# Patient Record
Sex: Female | Born: 1990 | Hispanic: Yes | Marital: Married | Smoking: Never smoker
Health system: Southern US, Community
[De-identification: ages and names within clinical notes are randomized; demographics above are authoritative.]

## PROBLEM LIST (undated history)

## (undated) ENCOUNTER — Inpatient Hospital Stay (HOSPITAL_COMMUNITY): Payer: Self-pay

## (undated) DIAGNOSIS — F1010 Alcohol abuse, uncomplicated: Secondary | ICD-10-CM

## (undated) DIAGNOSIS — F319 Bipolar disorder, unspecified: Secondary | ICD-10-CM

## (undated) DIAGNOSIS — F419 Anxiety disorder, unspecified: Secondary | ICD-10-CM

## (undated) DIAGNOSIS — F332 Major depressive disorder, recurrent severe without psychotic features: Secondary | ICD-10-CM

## (undated) DIAGNOSIS — R45851 Suicidal ideations: Secondary | ICD-10-CM

## (undated) HISTORY — DX: Alcohol abuse, uncomplicated: F10.10

## (undated) HISTORY — DX: Bipolar disorder, unspecified: F31.9

## (undated) HISTORY — DX: Suicidal ideations: R45.851

## (undated) HISTORY — PX: OTHER SURGICAL HISTORY: SHX169

## (undated) HISTORY — DX: Major depressive disorder, recurrent severe without psychotic features: F33.2

---

## 2015-04-14 ENCOUNTER — Emergency Department (HOSPITAL_COMMUNITY)
Admission: EM | Admit: 2015-04-14 | Discharge: 2015-04-14 | Disposition: A | Discharge status: Other Acute Inpatient Hospital | Payer: Federal, State, Local not specified - Other | Attending: Emergency Medicine | Admitting: Emergency Medicine

## 2015-04-14 ENCOUNTER — Encounter (HOSPITAL_COMMUNITY): Payer: Self-pay | Admitting: *Deleted

## 2015-04-14 ENCOUNTER — Inpatient Hospital Stay (HOSPITAL_COMMUNITY)
Admission: AD | Admit: 2015-04-14 | Discharge: 2015-04-18 | DRG: 885 | Disposition: A | Discharge status: Home / Self Care | Payer: Federal, State, Local not specified - Other | Source: Intra-hospital | Attending: Psychiatry | Admitting: Psychiatry

## 2015-04-14 DIAGNOSIS — F10129 Alcohol abuse with intoxication, unspecified: Secondary | ICD-10-CM | POA: Insufficient documentation

## 2015-04-14 DIAGNOSIS — F332 Major depressive disorder, recurrent severe without psychotic features: Secondary | ICD-10-CM

## 2015-04-14 DIAGNOSIS — R45851 Suicidal ideations: Secondary | ICD-10-CM

## 2015-04-14 DIAGNOSIS — F322 Major depressive disorder, single episode, severe without psychotic features: Secondary | ICD-10-CM | POA: Diagnosis present

## 2015-04-14 DIAGNOSIS — Z3202 Encounter for pregnancy test, result negative: Secondary | ICD-10-CM | POA: Insufficient documentation

## 2015-04-14 DIAGNOSIS — F1010 Alcohol abuse, uncomplicated: Secondary | ICD-10-CM | POA: Diagnosis present

## 2015-04-14 DIAGNOSIS — F419 Anxiety disorder, unspecified: Secondary | ICD-10-CM | POA: Diagnosis present

## 2015-04-14 DIAGNOSIS — G4700 Insomnia, unspecified: Secondary | ICD-10-CM | POA: Diagnosis present

## 2015-04-14 DIAGNOSIS — Z818 Family history of other mental and behavioral disorders: Secondary | ICD-10-CM

## 2015-04-14 HISTORY — DX: Anxiety disorder, unspecified: F41.9

## 2015-04-14 LAB — CBC
HCT: 42.3 % (ref 36.0–46.0)
Hemoglobin: 13.7 g/dL (ref 12.0–15.0)
MCH: 29.6 pg (ref 26.0–34.0)
MCHC: 32.4 g/dL (ref 30.0–36.0)
MCV: 91.4 fL (ref 78.0–100.0)
PLATELETS: 294 10*3/uL (ref 150–400)
RBC: 4.63 MIL/uL (ref 3.87–5.11)
RDW: 13.4 % (ref 11.5–15.5)
WBC: 7.3 10*3/uL (ref 4.0–10.5)

## 2015-04-14 LAB — COMPREHENSIVE METABOLIC PANEL
ALK PHOS: 64 U/L (ref 38–126)
ALT: 17 U/L (ref 14–54)
AST: 24 U/L (ref 15–41)
Albumin: 4.5 g/dL (ref 3.5–5.0)
Anion gap: 12 (ref 5–15)
BILIRUBIN TOTAL: 0.4 mg/dL (ref 0.3–1.2)
BUN: 11 mg/dL (ref 6–20)
CALCIUM: 9.4 mg/dL (ref 8.9–10.3)
CO2: 20 mmol/L — ABNORMAL LOW (ref 22–32)
CREATININE: 0.59 mg/dL (ref 0.44–1.00)
Chloride: 113 mmol/L — ABNORMAL HIGH (ref 101–111)
Glucose, Bld: 71 mg/dL (ref 65–99)
Potassium: 3.9 mmol/L (ref 3.5–5.1)
Sodium: 145 mmol/L (ref 135–145)
TOTAL PROTEIN: 7.6 g/dL (ref 6.5–8.1)

## 2015-04-14 LAB — RAPID URINE DRUG SCREEN, HOSP PERFORMED
AMPHETAMINES: NOT DETECTED
Barbiturates: NOT DETECTED
Benzodiazepines: NOT DETECTED
Cocaine: NOT DETECTED
Opiates: NOT DETECTED
Tetrahydrocannabinol: NOT DETECTED

## 2015-04-14 LAB — SALICYLATE LEVEL: Salicylate Lvl: <4 mg/dL (ref 2.8–30.0)

## 2015-04-14 LAB — I-STAT BETA HCG BLOOD, ED (MC, WL, AP ONLY)

## 2015-04-14 LAB — ACETAMINOPHEN LEVEL: Acetaminophen (Tylenol), Serum: <10 ug/mL — ABNORMAL LOW (ref 10–30)

## 2015-04-14 LAB — ETHANOL: ALCOHOL ETHYL (B): 61 mg/dL — AB (ref <5)

## 2015-04-14 MED ORDER — TRAZODONE HCL 50 MG PO TABS
50.0000 mg | ORAL_TABLET | Freq: Every day | ORAL | Status: DC
  Administered 2015-04-14 – 2015-04-17 (×4): 50 mg via ORAL
  Filled 2015-04-14 (×9): qty 1

## 2015-04-14 MED ORDER — FLUOXETINE HCL 10 MG PO CAPS
10.0000 mg | ORAL_CAPSULE | Freq: Every day | ORAL | Status: DC
  Administered 2015-04-14: 10 mg via ORAL
  Filled 2015-04-14: qty 1

## 2015-04-14 MED ORDER — ALUM & MAG HYDROXIDE-SIMETH 200-200-20 MG/5ML PO SUSP: 30.0000 mL | ORAL | Status: DC | PRN

## 2015-04-14 MED ORDER — FLUOXETINE HCL 10 MG PO CAPS
10.0000 mg | ORAL_CAPSULE | Freq: Every day | ORAL | Status: DC
  Administered 2015-04-15: 10 mg via ORAL
  Filled 2015-04-14 (×4): qty 1

## 2015-04-14 MED ORDER — TRAZODONE HCL 50 MG PO TABS: 50.0000 mg | ORAL_TABLET | Freq: Every day | ORAL | Status: DC

## 2015-04-14 MED ORDER — MAGNESIUM HYDROXIDE 400 MG/5ML PO SUSP: 30.0000 mL | Freq: Every day | ORAL | Status: DC | PRN

## 2015-04-14 MED ORDER — LORAZEPAM 1 MG PO TABS: 1.0000 mg | ORAL_TABLET | Freq: Three times a day (TID) | ORAL | Status: DC | PRN

## 2015-04-14 MED ORDER — ONDANSETRON HCL 4 MG PO TABS
4.0000 mg | ORAL_TABLET | Freq: Three times a day (TID) | ORAL | Status: DC | PRN
  Administered 2015-04-17: 4 mg via ORAL
  Filled 2015-04-14: qty 1

## 2015-04-14 MED ORDER — ALUM & MAG HYDROXIDE-SIMETH 200-200-20 MG/5ML PO SUSP
30.0000 mL | ORAL | Status: DC | PRN
Start: 2015-04-14 — End: 2015-04-14

## 2015-04-14 MED ORDER — ACETAMINOPHEN 325 MG PO TABS: 650.0000 mg | ORAL_TABLET | ORAL | Status: DC | PRN

## 2015-04-14 MED ORDER — ONDANSETRON HCL 4 MG PO TABS: 4.0000 mg | ORAL_TABLET | Freq: Three times a day (TID) | ORAL | Status: DC | PRN

## 2015-04-14 MED ORDER — ACETAMINOPHEN 325 MG PO TABS: 650.0000 mg | ORAL_TABLET | Freq: Four times a day (QID) | ORAL | Status: DC | PRN

## 2015-04-14 NOTE — ED Notes (Signed)
Placed pt's belongings in locker #30. Pt is resting quietly in bed with lights off and blinds/curtains open to nurse's station.  No acute distress noted.
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
## 2015-04-14 NOTE — Discharge Instructions (Signed)
Pt Transferred to rm 402-1 @[facility]  Report called to Sears Holdings CorporationChris Rn . Pt reports passive S/I Transported via [facility] affect blunted .

## 2015-04-14 NOTE — Tx Team (Signed)
Initial Interdisciplinary Treatment Plan   PATIENT STRESSORS: Financial difficulties Marital or family conflict Occupational concerns Substance abuse   PATIENT STRENGTHS: Ability for insight Active sense of humor Average or above average intelligence Capable of independent living Communication skills General fund of knowledge Motivation for treatment/growth Physical Health Supportive family/friends Work skills   PROBLEM LIST: Problem List/Patient Goals Date to be addressed Date deferred Reason deferred Estimated date of resolution  "depression" 04/14/2015   D/c  "anxiety" 04/14/2015   D/c  "suicidal thoughts" 04/14/2015   D/c  "alcohol" 04/14/2015   D/c  "panic attacks" 04/14/2015   D/c                           DISCHARGE CRITERIA:  Ability to meet basic life and health needs Adequate post-discharge living arrangements Improved stabilization in mood, thinking, and/or behavior Medical problems require only outpatient monitoring Motivation to continue treatment in a less acute level of care Need for constant or close observation no longer present Reduction of life-threatening or endangering symptoms to within safe limits Safe-care adequate arrangements made Verbal commitment to aftercare and medication compliance Withdrawal symptoms are absent or subacute and managed without 24-hour nursing intervention  PRELIMINARY DISCHARGE PLAN: Attend aftercare/continuing care group Attend PHP/IOP Attend 12-step recovery group Outpatient therapy Return to previous living arrangement Return to previous work or school arrangements  PATIENT/FAMIILY INVOLVEMENT: This treatment plan has been presented to and reviewed with the patient, Erin Sullivan".  The patient and family have been given the opportunity to ask questions and make suggestions.  Earline MayotteKnight, Lillia Lengel Shephard 04/14/2015, 7:38 PM

## 2015-04-14 NOTE — BH Assessment (Signed)
[facility] Assessment Progress Note  Per Thedore MinsMojeed Akintayo, MD, this pt requires psychiatric hospitalization at this time.  Cliffton AstersKenishia, RN, [facility]  Pt has signed Voluntary Admission and Consent for Treatment, as well as Consent to Release Information to no one, and signed forms [facility].  Pt's nurse, Lupita LeashDonna, has been notified, and agrees to send original paperwork along with pt via [facility], and to call report to [phone].  Doylene Canninghomas Stasia Somero, MA Triage Specialist [phone]

## 2015-04-14 NOTE — [facility] Counselor (Signed)
Patient has been accepted to 402-1 by Dr. Jannifer FranklinAkintayo. Patients nurse and NP made aware of acceptance. Patient can be transported after 3:30PM.  Davina PokeJoVea Kiauna Zywicki, LCSW Therapeutic Triage Specialist [facility] 04/14/2015 2:10 PM

## 2015-04-14 NOTE — ED Notes (Addendum)
Pt appears very blunted and depressed. She stated she does feel Si but does contract for safety. Pt remains with a sitter. 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 She has been depressed for a long time but never received treatment. Pt stated she has two plans to die. She often thinks of crashing her car and also thought about cutting herself. Pt was wiping away tears as she spoke. She stated "a lot of things have happened to people I care about." "I am not sleeping." Pt was engaged and gave her BF back the ring two days ago. She stated,"I planned to quit my job and go with him but I am not sure now." Pt lives with her sister. She denies any abuse in the past and denies any legal or financial difficulties. 9am _pt refused to eat breakfast. Pt is very soft spoken with good eye contact. 12:20pm _Phoned [facility] to give report. [facility] staff will call back. Charge nurse, Darl PikesSusan made aware. 1:30pm _ Report to Lupita LeashDonna.

## 2015-04-14 NOTE — [facility] Group Notes (Signed)
Pt did not attend wrap up group.  Nichol Ator, MHT 

## 2015-04-14 NOTE — BH Assessment (Signed)
Assessment Note  Erin Sullivan is an 25 y.o. female. Patient reports driving herself to the Emergency department because of thoughts of wanting to kill self.  Patient continues to endorse suicidal thoughts with a plan to cut self. "I didn't feel safe alone, like I would kill myself".   Patient reports a previous attempt by overdose 3 years ago but she did not seek treatment or tell her family.  Patient does not want her family to know she is here seeking treatment.  Patient denies any inpatient or outpatient treatment in the past for mental health or substance abuse.  Patient denies homicidal ideations, A/VH, and other self injurious behaviors.  Patient reports a family history of mental health diagnosis unknown to her at this time.  Patient reports her brother struggles with hallucinations, mother is bipolar with past suicide attempts, 2 cousins killed themselves by hanging, and her grandfather attempted murder suicide although did not kill her grandmother.    Patient reports drinking alcohol(vodka) 3-4 times a week, started drinking at age 25, and she noticed her drinking getting out of control at age 25 but currently denies a need for treatment.    Patient currently recognize a need for current mental health treatment.    Diagnosis: Major Depressive Disorder, single episode, severe  Past Medical History: History reviewed. No pertinent past medical history.  Past Surgical History  Procedure Laterality Date  . Birthmark removal      from posterior leg    Family History: No family history on file.  Social History:  reports that she has never smoked. She does not have any smokeless tobacco history on file. She reports that she drinks alcohol. She reports that she does not use illicit drugs.  Additional Social History:  Alcohol / Drug Use Pain Medications: see chart Prescriptions: see chart Over the Counter: see chart History of alcohol / drug use?: Yes Longest period of sobriety (when/how  long): couple months Negative Consequences of Use:  (pt does not believe drinking is a problems.) Withdrawal Symptoms:  (denies current or history of withdrawal sx) Substance #1 Name of Substance 1: alcohol 1 - Age of First Use: 21 1 - Amount (size/oz): 6-8oz vodka 1 - Frequency: 3-4 times weekly 1 - Last Use / Amount: 04/11/14  CIWA: CIWA-Ar BP: 129/94 mmHg Pulse Rate: 77 Nausea and Vomiting: no nausea and no vomiting Tactile Disturbances: none Tremor: no tremor Auditory Disturbances: not present Paroxysmal Sweats: no sweat visible Visual Disturbances: not present Anxiety: no anxiety, at ease Headache, Fullness in Head: none present Agitation: normal activity Orientation and Clouding of Sensorium: oriented and can do serial additions CIWA-Ar Total: 0 COWS:    Allergies: No Known Allergies  Home Medications:  (Not in a hospital admission)  OB/GYN Status:  Patient's last menstrual period was 03/13/2015.  General Assessment Data Location of Assessment: [facility] TTS Assessment: In system Is this a Tele or Face-to-Face Assessment?: Face-to-Face Is this an Initial Assessment or a Re-assessment for this encounter?: Initial Assessment Marital status: Single Maiden name: Clarene Sullivan Is patient pregnant?: No Pregnancy Status: No Living Arrangements: Other relatives (Sister) Can pt return to current living arrangement?: Yes Admission Status: Voluntary Is patient capable of signing voluntary admission?: Yes Referral Source: Self/Family/Friend Insurance type: none  Medical Screening Exam [facility] Walk-[facility] ONLY) Medical Exam completed: Yes  Crisis Care Plan Living Arrangements: Other relatives (Sister) Name of Psychiatrist: none Name of Therapist: none  Education Status Is patient currently in school?: No Current Grade: na Highest grade of  school patient has completed: some college Name of school: na Contact person: na  Risk to self with the past 6 months Suicidal Ideation:  Yes-Currently Present Has patient been a risk to self within the past 6 months prior to admission? : No Suicidal Intent: Yes-Currently Present Has patient had any suicidal intent within the past 6 months prior to admission? : No Is patient at risk for suicide?: Yes Suicidal Plan?: Yes-Currently Present Has patient had any suicidal plan within the past 6 months prior to admission? : No Specify Current Suicidal Plan: cut self Access to Means: Yes Specify Access to Suicidal Means: any sharpe items What has been your use of drugs/alcohol within the last 12 months?: alcohol Previous Attempts/Gestures: Yes How many times?: 1 Other Self Harm Risks: denies Triggers for Past Attempts: Family contact, Other personal contacts, Other (Comment) (depression) Intentional Self Injurious Behavior: None Family Suicide History: Yes (multiple people in immediate family) Recent stressful life event(s): Conflict (Comment), Loss (Comment), Financial Problems, Other (Comment) (overwhelmed) Persecutory voices/beliefs?: No Depression: Yes Depression Symptoms: Tearfulness, Loss of interest in usual pleasures, Feeling worthless/self pity, Feeling angry/irritable (hopelessness) Substance abuse history and/or treatment for substance abuse?: Yes  Risk to Others within the past 6 months Homicidal Ideation: No-Not Currently/Within Last 6 Months Does patient have any lifetime risk of violence toward others beyond the six months prior to admission? : No Thoughts of Harm to Others: No-Not Currently Present/Within Last 6 Months Current Homicidal Intent: No-Not Currently/Within Last 6 Months Current Homicidal Plan: No-Not Currently/Within Last 6 Months Access to Homicidal Means: No Identified Victim: na History of harm to others?: No Assessment of Violence: None Noted Violent Behavior Description: na Does patient have access to weapons?: No Criminal Charges Pending?: No Does patient have a court date: No Is patient  on probation?: No  Psychosis Hallucinations: None noted Delusions: None noted  Mental Status Report Appearance/Hygiene: In hospital gown Eye Contact: Good Motor Activity: Freedom of movement Speech: Logical/coherent Level of Consciousness: Alert Mood: Depressed Affect: Depressed Anxiety Level: None Thought Processes: Relevant Judgement: Unimpaired Orientation: Person, Place, Time, Situation, Appropriate for developmental age Obsessive Compulsive Thoughts/Behaviors: None  Cognitive Functioning Concentration: Normal Memory: Recent Intact, Remote Intact IQ: Average Insight: Fair Impulse Control: Fair Appetite: Fair Weight Loss: 0 Weight Gain: 0 Sleep: Decreased Total Hours of Sleep: 4 Vegetative Symptoms: None  ADLScreening [facility] Assessment Services) Patient's cognitive ability adequate to safely complete daily activities?: Yes Patient able to express need for assistance with ADLs?: Yes Independently performs ADLs?: Yes (appropriate for developmental age)  Prior Inpatient Therapy Prior Inpatient Therapy: No Prior Therapy Dates: na Prior Therapy Facilty/Provider(s): na Reason for Treatment: na  Prior Outpatient Therapy Prior Outpatient Therapy: No Prior Therapy Dates: na Prior Therapy Facilty/Provider(s): na Reason for Treatment: na Does patient have an ACCT team?: No Does patient have Intensive In-House Services?  : No Does patient have [facility] services? : No Does patient have P4CC services?: No  ADL Screening (condition at time of admission) Patient's cognitive ability adequate to safely complete daily activities?: Yes Patient able to express need for assistance with ADLs?: Yes Independently performs ADLs?: Yes (appropriate for developmental age)       Abuse/Neglect Assessment (Assessment to be complete while patient is alone) Physical Abuse: Denies Verbal Abuse: Denies Sexual Abuse: Denies Exploitation of patient/patient's resources:  Denies Self-Neglect: Denies Values / Beliefs Cultural Requests During Hospitalization: None Spiritual Requests During Hospitalization: None Consults Spiritual Care Consult Needed: No Social Work Consult Needed: No Merchant navy officer (For Healthcare)  Does patient have an advance directive?: No Would patient like information on creating an advanced directive?: No - patient declined information    Additional Information 1:1 In Past 12 Months?: No CIRT Risk: No Elopement Risk: No Does patient have medical clearance?: Yes     Disposition:  Disposition Initial Assessment Completed for this Encounter: Yes Disposition of Patient: Other dispositions (Pending) Other disposition(s): Other (Comment) (Pending)  On Site Evaluation by:   Reviewed with Physician:    Maryelizabeth Rowan A 04/14/2015 7:58 AM

## 2015-04-14 NOTE — ED Notes (Signed)
Bed: WA30 Expected date:  Expected time:  Means of arrival:  Comments: 

## 2015-04-14 NOTE — Consult Note (Signed)
[place] [facility] Consult   Reason for Consult:  Suicidal ideations Referring Physician:  EDP Patient Identification: Erin Sullivan MRN:  [id number] Principal Diagnosis: Severe recurrent major depression without psychotic features [facility]) Diagnosis:   Patient Active Problem List   Diagnosis Date Noted  . Severe recurrent major depression without psychotic features ([place]) [F33.2] 04/14/2015    Priority: High  . Alcohol abuse [F10.10] 04/14/2015    Priority: High    Total Time spent with patient: 45 minutes  Subjective:   Erin Sullivan is a 25 y.o. female patient admitted with suicidal ideations and a plan to overdose.  HPI:  On admission:  25 y.o. female. Patient reports driving herself to the Emergency department because of thoughts of wanting to kill self. Patient continues to endorse suicidal thoughts with a plan to cut self. "I didn't feel safe alone, like I would kill myself". Patient reports a previous attempt by overdose 3 years ago but she did not seek treatment or tell her family. Patient does not want her family to know she is here seeking treatment. Patient denies any inpatient or outpatient treatment in the past for mental health or substance abuse. Patient denies homicidal ideations, A/VH, and other self injurious behaviors. Patient reports a family history of mental health diagnosis unknown to her at this time. Patient reports her brother struggles with hallucinations, mother is bipolar with past suicide attempts, 2 cousins killed themselves by hanging, and her grandfather attempted murder suicide although did not kill her grandmother.   Patient reports drinking alcohol(vodka) 3-4 times a week, started drinking at age 80, and she noticed her drinking getting out of control at age 75 but currently denies a need for treatment.   Today:  Patient continues to be depressed and endorses suicidal ideations with a plan to overdose or cut herself.  She did overdose  four years ago but never received help when she did not die.  No treatment in the past.  She currently lives with her sister, sister's boyfriend, and her sister's children.  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  They are having relationship issues at this time.  Denies homicidal ideations, hallucinations, and withdrawal symptoms.  Past Psychiatric History: depression, alcohol abuse  Risk to Self: Suicidal Ideation: Yes-Currently Present Suicidal Intent: Yes-Currently Present Is patient at risk for suicide?: Yes Suicidal Plan?: Yes-Currently Present Specify Current Suicidal Plan: cut self Access to Means: Yes Specify Access to Suicidal Means: any sharpe items What has been your use of drugs/alcohol within the last 12 months?: alcohol How many times?: 1 Other Self Harm Risks: denies Triggers for Past Attempts: Family contact, Other personal contacts, Other (Comment) (depression) Intentional Self Injurious Behavior: None Risk to Others: Homicidal Ideation: No-Not Currently/Within Last 6 Months Thoughts of Harm to Others: No-Not Currently Present/Within Last 6 Months Current Homicidal Intent: No-Not Currently/Within Last 6 Months Current Homicidal Plan: No-Not Currently/Within Last 6 Months Access to Homicidal Means: No Identified Victim: na History of harm to others?: No Assessment of Violence: None Noted Violent Behavior Description: na Does patient have access to weapons?: No Criminal Charges Pending?: No Does patient have a court date: No Prior Inpatient Therapy: Prior Inpatient Therapy: No Prior Therapy Dates: na Prior Therapy Facilty/Provider(s): na Reason for Treatment: na Prior Outpatient Therapy: Prior Outpatient Therapy: No Prior Therapy Dates: na Prior Therapy Facilty/Provider(s): na Reason for Treatment: na Does patient  have an ACCT team?: No Does  patient have Intensive In-House Services?  : No Does patient have [facility] services? : No Does patient have P4CC services?: No  Past Medical History: History reviewed. No pertinent past medical history.  Past Surgical History  Procedure Laterality Date  . Birthmark removal      from posterior leg   Family History: No family history on file. Family Psychiatric  History: None Social History:  History  Alcohol Use  . Yes    Comment: pt states at least 3 times a week     History  Drug Use No    Social History   Social History  . Marital Status: Single    Spouse Name: N/A  . Number of Children: N/A  . Years of Education: N/A   Social History Main Topics  . Smoking status: Never Smoker   . Smokeless tobacco: None  . Alcohol Use: Yes     Comment: pt states at least 3 times a week  . Drug Use: No  . Sexual Activity: Not Asked   Other Topics Concern  . None   Social History Narrative  . None   Additional Social History:    Pain Medications: see chart Prescriptions: see chart Over the Counter: see chart History of alcohol / drug use?: Yes Longest period of sobriety (when/how long): couple months Negative Consequences of Use:  (pt does not believe drinking is a problems.) Withdrawal Symptoms:  (denies current or history of withdrawal sx) Name of Substance 1: alcohol 1 - Age of First Use: 21 1 - Amount (size/oz): 6-8oz vodka 1 - Frequency: 3-4 times weekly 1 - Last Use / Amount: 04/11/14                   Allergies:  No Known Allergies  Labs:  Results for orders placed or performed during the hospital encounter of 04/14/15 (from the past 48 hour(s))  Comprehensive metabolic panel     Status: Abnormal   Collection Time: 04/14/15  4:40 AM  Result Value Ref Range   Sodium 145 135 - 145 mmol/L   Potassium 3.9 3.5 - 5.1 mmol/L   Chloride 113 (H) 101 - 111 mmol/L   CO2 20 (L) 22 - 32 mmol/L   Glucose, Bld 71 65 - 99 mg/dL   BUN 11 6 - 20 mg/dL   Creatinine,  Ser 0.12 0.44 - 1.00 mg/dL   Calcium 9.4 8.9 - 58.9 mg/dL   Total Protein 7.6 6.5 - 8.1 g/dL   Albumin 4.5 3.5 - 5.0 g/dL   AST 24 15 - 41 U/L   ALT 17 14 - 54 U/L   Alkaline Phosphatase 64 38 - 126 U/L   Total Bilirubin 0.4 0.3 - 1.2 mg/dL   GFR calc non Af Amer >60 >60 mL/min   GFR calc Af Amer >60 >60 mL/min    Comment: (NOTE) The eGFR has been calculated using the CKD EPI equation. This calculation has not been validated in all clinical situations. eGFR's persistently <60 mL/min signify possible Chronic Kidney Disease.    Anion gap 12 5 - 15  Ethanol (ETOH)     Status: Abnormal   Collection Time: 04/14/15  4:40 AM  Result Value Ref Range   Alcohol, Ethyl (B) 61 (H) <5 mg/dL    Comment:        LOWEST DETECTABLE LIMIT FOR SERUM ALCOHOL IS 5 mg/dL FOR MEDICAL PURPOSES ONLY   Salicylate level     Status:  None   Collection Time: 04/14/15  4:40 AM  Result Value Ref Range   Salicylate Lvl <4.0 2.8 - 30.0 mg/dL  Acetaminophen level     Status: Abnormal   Collection Time: 04/14/15  4:40 AM  Result Value Ref Range   Acetaminophen (Tylenol), Serum <10 (L) 10 - 30 ug/mL    Comment:        THERAPEUTIC CONCENTRATIONS VARY SIGNIFICANTLY. A RANGE OF 10-30 ug/mL MAY BE AN EFFECTIVE CONCENTRATION FOR MANY PATIENTS. HOWEVER, SOME ARE BEST TREATED AT CONCENTRATIONS OUTSIDE THIS RANGE. ACETAMINOPHEN CONCENTRATIONS >150 ug/mL AT 4 HOURS AFTER INGESTION AND >50 ug/mL AT 12 HOURS AFTER INGESTION ARE OFTEN ASSOCIATED WITH TOXIC REACTIONS.   CBC     Status: None   Collection Time: 04/14/15  4:40 AM  Result Value Ref Range   WBC 7.3 4.0 - 10.5 K/uL   RBC 4.63 3.87 - 5.11 MIL/uL   Hemoglobin 13.7 12.0 - 15.0 g/dL   HCT 42.3 36.0 - 46.0 %   MCV 91.4 78.0 - 100.0 fL   MCH 29.6 26.0 - 34.0 pg   MCHC 32.4 30.0 - 36.0 g/dL   RDW 13.4 11.5 - 15.5 %   Platelets 294 150 - 400 K/uL  I-Stat beta hCG blood, ED ([facility], [facility], [facility] only)     Status: None   Collection Time: 04/14/15  4:52 AM  Result  Value Ref Range   I-stat hCG, quantitative <5.0 <5 mIU/mL   Comment 3            Comment:   GEST. AGE      CONC.  (mIU/mL)   <=1 WEEK        5 - 50     2 WEEKS       50 - 500     3 WEEKS       100 - 10,000     4 WEEKS     1,000 - 30,000        FEMALE AND NON-PREGNANT FEMALE:     LESS THAN 5 mIU/mL   Urine rapid drug screen (hosp performed) (Not at [facility])     Status: None   Collection Time: 04/14/15  5:59 AM  Result Value Ref Range   Opiates NONE DETECTED NONE DETECTED   Cocaine NONE DETECTED NONE DETECTED   Benzodiazepines NONE DETECTED NONE DETECTED   Amphetamines NONE DETECTED NONE DETECTED   Tetrahydrocannabinol NONE DETECTED NONE DETECTED   Barbiturates NONE DETECTED NONE DETECTED    Comment:        DRUG SCREEN FOR MEDICAL PURPOSES ONLY.  IF CONFIRMATION IS NEEDED FOR ANY PURPOSE, NOTIFY LAB WITHIN 5 DAYS.        LOWEST DETECTABLE LIMITS FOR URINE DRUG SCREEN Drug Class       Cutoff (ng/mL) Amphetamine      1000 Barbiturate      200 Benzodiazepine   102 Tricyclics       725 Opiates          300 Cocaine          300 THC              50     Current Facility-Administered Medications  Medication Dose Route Frequency Provider Last Rate Last Dose  . acetaminophen (TYLENOL) tablet 650 mg  650 mg Oral Q4H PRN Tatyana Kirichenko, PA-C      . alum & mag hydroxide-simeth (MAALOX/MYLANTA) 200-200-20 MG/5ML suspension 30 mL  30 mL Oral PRN Tatyana Kirichenko, PA-C      .  FLUoxetine (PROZAC) capsule 10 mg  10 mg Oral Daily Angeleigh Chiasson   10 mg at 04/14/15 1150  . ondansetron (ZOFRAN) tablet 4 mg  4 mg Oral Q8H PRN Tatyana Kirichenko, PA-C      . traZODone (DESYREL) tablet 50 mg  50 mg Oral QHS Amalya Salmons       No current outpatient prescriptions on file.    Musculoskeletal: Strength & Muscle Tone: within normal limits Gait & Station: normal Patient leans: N/A  Psychiatric Specialty Exam: Review of Systems  Constitutional: Negative.   HENT: Negative.   Eyes:  Negative.   Respiratory: Negative.   Cardiovascular: Negative.   Gastrointestinal: Negative.   Genitourinary: Negative.   Musculoskeletal: Negative.   Skin: Negative.   Neurological: Negative.   Endo/Heme/Allergies: Negative.   Psychiatric/Behavioral: Positive for depression, suicidal ideas and substance abuse.    Blood pressure 106/70, pulse 88, temperature 98.4 F (36.9 C), temperature source Oral, resp. rate 18, height _0  (1.626 m), weight 48.988 kg (108 lb), last menstrual period 03/13/2015, SpO2 96 %.Body mass index is 18.53 kg/(m^2).  General Appearance: Casual  Eye Contact::  Minimal  Speech:  Normal Rate  Volume:  Decreased  Mood:  Depressed  Affect:  Congruent  Thought Process:  Coherent  Orientation:  Full (Time, Place, and Person)  Thought Content:  Rumination  Suicidal Thoughts:  Yes.  with intent/plan  Homicidal Thoughts:  No  Memory:  Immediate;   Fair Recent;   Fair Remote;   Fair  Judgement:  Impaired  Insight:  Fair  Psychomotor Activity:  Decreased  Concentration:  Fair  Recall:  [place] of Knowledge:Good  Language: Good  Akathisia:  No  Handed:  Right  AIMS (if indicated):     Assets:  Housing Leisure Time Physical Health Resilience Social Support  ADL's:  Intact  Cognition: WNL  Sleep:      Treatment Plan Summary: Daily contact with patient to assess and evaluate symptoms and progress in treatment, Medication management and Plan major depression, recurrent, severe without psychotic features:  -Crisis stabilization -Medication management:  Prozac 10 mg daily for depression and Trazodone 50 mg at bedtime for sleep issues started -Individual and substance abuse counseling -Transferred to [facility] inpatient unit   Disposition: Recommend psychiatric Inpatient admission when medically cleared.  Waylan Boga, [place] 04/14/2015 2:16 PM Patient seen face-to-face for psychiatric evaluation, chart reviewed and case discussed with the physician  extender and developed treatment plan. Reviewed the information documented and agree with the treatment plan. Corena Pilgrim, MD

## 2015-04-14 NOTE — ED Notes (Signed)
Pt states that she has been depressed and thinking about suicide for a long time; pt states that she has had these thoughts since Middle School; pt states that the thoughts have been getting worse over the last few days; pt states that she has thoughts of cutting herself or running her car off the road; pt tearful in triage and states "I just can't deal with it anymore."; pt states that she has never been treated for depression or talked to anyone about her feelings

## 2015-04-14 NOTE — ED Provider Notes (Signed)
CSN: [id number]     Arrival date & time 04/14/15  4098 History   First MD Initiated Contact with Patient 04/14/15 0413     Chief Complaint  Patient presents with  . Suicidal     (Consider location/radiation/quality/duration/timing/severity/associated sxs/prior Treatment) HPI Erin Sullivan is a 25 y.o. female limited medical problems, presents to emergency department complaining of depression and suicidal thoughts. Patient states she has had depression and suicidal thoughts since middle school. She states she currently lives with her sister. She states that she has been feeling worse recently, states nothing in particular triggered it. She states that sometimes when she drives she has bad thoughts of driving off or into a tree. Patient also thought about cutting herself. She denies any attempts. She states that she did not tell anybody that she is here. She has never sick health or spoke with anybody about this. She is here voluntarily  History reviewed. No pertinent past medical history. Past Surgical History  Procedure Laterality Date  . Birthmark removal      from posterior leg   No family history on file. Social History  Substance Use Topics  . Smoking status: Never Smoker   . Smokeless tobacco: None  . Alcohol Use: Yes     Comment: pt states at least 3 times a week   OB History    No data available     Review of Systems  Constitutional: Negative for fever and chills.  Respiratory: Negative for cough, chest tightness and shortness of breath.   Cardiovascular: Negative for chest pain, palpitations and leg swelling.  Gastrointestinal: Negative for nausea, vomiting, abdominal pain and diarrhea.  Genitourinary: Negative for dysuria, flank pain and pelvic pain.  Musculoskeletal: Negative for myalgias, arthralgias, neck pain and neck stiffness.  Skin: Negative for rash.  Neurological: Negative for dizziness, weakness and headaches.  Psychiatric/Behavioral: Positive for suicidal  ideas and dysphoric mood. The patient is nervous/anxious.   All other systems reviewed and are negative.     Allergies  Review of patient's allergies indicates no known allergies.  Home Medications   Prior to Admission medications   Not on File   BP 129/94 mmHg  Pulse 77  Temp(Src) 98.4 F (36.9 C) (Oral)  Resp 14  Ht 5\' 4"  (1.626 m)  Wt 48.988 kg  BMI 18.53 kg/m2  SpO2 100%  LMP 03/13/2015 Physical Exam  Constitutional: She appears well-developed and well-nourished. No distress.  HENT:  Head: Normocephalic.  Eyes: Conjunctivae are normal.  Neck: Neck supple.  Cardiovascular: Normal rate, regular rhythm and normal heart sounds.   Pulmonary/Chest: Effort normal and breath sounds normal. No respiratory distress. She has no wheezes. She has no rales.  Abdominal: Soft. Bowel sounds are normal. She exhibits no distension. There is no tenderness. There is no rebound.  Musculoskeletal: She exhibits no edema.  Neurological: She is alert.  Skin: Skin is warm and dry.  Psychiatric: Her behavior is normal.  Pt is tearful, reports SI. No HI  Nursing note and vitals reviewed.   ED Course  Procedures (including critical care time) Labs Review Labs Reviewed  COMPREHENSIVE METABOLIC PANEL - Abnormal; Notable for the following:    Chloride 113 (*)    CO2 20 (*)    All other components within normal limits  ETHANOL - Abnormal; Notable for the following:    Alcohol, Ethyl (B) 61 (*)    All other components within normal limits  ACETAMINOPHEN LEVEL - Abnormal; Notable for the following:    Acetaminophen (  Tylenol), Serum <10 (*)    All other components within normal limits  SALICYLATE LEVEL  CBC  URINE RAPID DRUG SCREEN, HOSP PERFORMED  I-STAT BETA HCG BLOOD, ED ([facility], [facility], [facility] ONLY)    Imaging Review No results found. I have personally reviewed and evaluated these images and lab results as part of my medical decision-making.   EKG Interpretation None      MDM   Final  diagnoses:  Severe recurrent major depression without psychotic features (HCC)  Alcohol abuse    Pt with depression, admits to SI for "years." Pt is tearful. No medical problems. Will get med clearance labs. Pt is voluntary.   Medically cleared pending TTS assessment.       Jaynie Crumbleatyana Lenae Wherley, PA-C 04/15/15 1325  April Palumbo, MD 04/19/15 2354

## 2015-04-15 DIAGNOSIS — F10129 Alcohol abuse with intoxication, unspecified: Secondary | ICD-10-CM | POA: Insufficient documentation

## 2015-04-15 DIAGNOSIS — R45851 Suicidal ideations: Secondary | ICD-10-CM | POA: Insufficient documentation

## 2015-04-15 MED ORDER — ARIPIPRAZOLE 2 MG PO TABS
2.0000 mg | ORAL_TABLET | Freq: Every day | ORAL | Status: DC
  Administered 2015-04-15 – 2015-04-18 (×4): 2 mg via ORAL
  Filled 2015-04-15 (×3): qty 1
  Filled 2015-04-15: qty 7
  Filled 2015-04-15 (×4): qty 1

## 2015-04-15 MED ORDER — FLUOXETINE HCL 20 MG PO CAPS
20.0000 mg | ORAL_CAPSULE | Freq: Every day | ORAL | Status: DC
  Administered 2015-04-16 – 2015-04-18 (×3): 20 mg via ORAL
  Filled 2015-04-15: qty 7
  Filled 2015-04-15 (×5): qty 1

## 2015-04-15 NOTE — [facility] Group Notes (Signed)
Adult Therapy Group Note  Date:  04/15/2015 Time:  1:45-2:45 PM  Group Topic/Focus:  Managing Feelings:   The focus of this group was to identify what feelings patients have difficulty handling and develop a plan to handle them in a healthier way upon discharge.  Focus was on anger outbursts and an Anger Evaluation tool was used to elicit thought and motivate toward change.  Participation Level:  Active  Participation Quality:  Attentive and Sharing  Affect:  Blunted and Depressed  Cognitive:  Appropriate  Insight: Good  Engagement in Group:  Engaged  Modes of Intervention:  Discussion and Motivational Interviewing  Additional Comments:  Erin Sullivan participated well in group and was more vocal than most other participants.  She was able to analyze her anger at her boyfriend recently.  Erin Sullivan, Erin Sullivan 04/15/2015, 3:46 PM

## 2015-04-15 NOTE — Progress Notes (Signed)
  D: Pt was laying in bed during the assessment. Pt didn't engage in conversation, but did answer reluctantly answer some questions. Pt didn't have any questions or concerns at this time.   A:  Support and encouragement was offered. 15 min checks continued for safety.  R: Pt remains safe.

## 2015-04-15 NOTE — [facility] Suicide Risk Assessment (Signed)
[facility]   Nursing information obtained from:  Patient Demographic factors:  Adolescent or young adult, Low socioeconomic status Current Mental Status:  Suicidal ideation indicated by patient Loss Factors:  Financial problems / change in socioeconomic status Historical Factors:  Prior suicide attempts Risk Reduction Factors:  Employed, Living with another person, especially a relative, Positive social support Total Time spent with patient: 1 hour Principal Problem: <principal problem not specified> Diagnosis:   Patient Active Problem List   Diagnosis Date Noted  . 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 Alcohol abuse [F10.10] 04/14/2015  . Major depressive disorder, recurrent episode, severe (HCC) [F33.2] 04/14/2015     Continued Clinical Symptoms:  Alcohol Use Disorder Identification Test Final Score (AUDIT): 27 The "Alcohol Use Disorders Identification Test", Guidelines for Use in Primary Care, Second Edition.  World Science writerHealth Organization [facility](WHO). Score between 0-7:  no or low risk or alcohol related problems. Score between 8-15:  moderate risk of alcohol related problems. Score between 16-19:  high risk of alcohol related problems. Score 20 or above:  warrants further diagnostic evaluation for alcohol dependence and treatment.   CLINICAL FACTORS:   Severe Anxiety and/or Agitation Depression:   Anhedonia Comorbid alcohol abuse/dependence Hopelessness Impulsivity Insomnia Recent sense of peace/wellbeing Severe Alcohol/Substance Abuse/Dependencies More than one psychiatric diagnosis Unstable or Poor Therapeutic Relationship   Musculoskeletal: Strength & Muscle Tone: within normal limits Gait & Station: normal Patient leans: N/A  Psychiatric Specialty Exam: Physical Exam  ROS patient denied nausea, vomiting, abdominal pain, shortness of breath, chest pain, sweating, shaking but continued to  endorse depression, anxiety, alcohol abuse suicidal ideation with intent and plan. No Fever-chills, No Headache, No changes with Vision or hearing, reports vertigo No problems swallowing food or Liquids, No Chest pain, Cough or Shortness of Breath, No Abdominal pain, No Nausea or Vommitting, Bowel movements are regular, No Blood in stool or Urine, No dysuria, No new skin rashes or bruises, No new joints pains-aches,  No new weakness, tingling, numbness in any extremity, No recent weight gain or loss, No polyuria, polydypsia or polyphagia,   A full 10 point Review of Systems was done, except as stated above, all other Review of Systems were negative.  Blood pressure 105/69, pulse 97, temperature 97.6 F (36.4 C), temperature source Oral, resp. rate 16, height 5\' 4"  (1.626 m), weight 47.628 kg (105 lb), last menstrual period 03/13/2015, SpO2 99 %.Body mass index is 18.01 kg/(m^2).  General Appearance: Guarded  Eye Contact::  Good  Speech:  Clear and Coherent and Slow  Volume:  Decreased  Mood:  Anxious, Depressed, Hopeless and Worthless  Affect:  Constricted and Depressed  Thought Process:  Coherent and Goal Directed  Orientation:  Full (Time, Place, and Person)  Thought Content:  Rumination  Suicidal Thoughts:  Yes.  with intent/plan  Homicidal Thoughts:  No  Memory:  Immediate;   Good Recent;   Good  Judgement:  Fair  Insight:  Fair  Psychomotor Activity:  Decreased  Concentration:  Good  Recall:  Good  Fund of Knowledge:Good  Language: Good  Akathisia:  Negative  Handed:  Right  AIMS (if indicated):     Assets:  Communication Skills Desire for Improvement Financial Resources/Insurance Housing Leisure Time Physical Health Resilience Social Support Talents/Skills Transportation  Sleep:  Number of Hours: 5  Cognition: WNL  ADL's:  Intact     COGNITIVE FEATURES THAT CONTRIBUTE TO RISK:  Closed-mindedness, Loss of executive function and Polarized  thinking     SUICIDE RISK:   Moderate:  Frequent suicidal ideation with limited intensity, and duration, some specificity in terms of plans, no associated intent, good self-control, limited dysphoria/symptomatology, some risk factors present, and identifiable protective factors, including available and accessible social support.  PLAN OF CARE: Admit for increased symptoms of depression, anxiety suicidal ideation with intent and plan. Patient also needed treatment for alcohol abuse and substance abuse counseling when medically stable.  Medical Decision Making:  Review of Psycho-Social Stressors (1), Review or order clinical lab tests (1), New Problem, with no additional work-up planned (3), Review of Last Therapy Session (1), Review or order medicine tests (1), Review of Medication Regimen & Side Effects (2) and Review of New Medication or Change in Dosage (2)  I certify that inpatient services furnished can reasonably be expected to improve the patient's condition.   Pernie Grosso,JANARDHAHA R. 04/15/2015, 5:26 PM

## 2015-04-15 NOTE — Progress Notes (Signed)
D Bethanie DickerRosanna is out in the milieu today...sitting quietly in the dayroom today with her blanket draped over her shoulders. She completed her daikly asse4ssment and on it she wrote she denied SI and she rated her depression, hopelessness and anxiety " 5/7/7/", respectively.    A She has attended groups and this evening is scheduled to have ehr prozac increased from 10 mg to 20 mg po QD and she will begin abilify 2 mg tonight .    R Safety is in place and poc cont.

## 2015-04-15 NOTE — H&P (Signed)
Psychiatric Admission Assessment Adult  Patient Identification: Erin Sullivan MRN:  [id number] Date of Evaluation:  04/15/2015 Chief Complaint:  MDD Principal Diagnosis: Major depressive disorder, recurrent episode, severe (HCC) Diagnosis:   Patient Active Problem List   Diagnosis Date Noted  . Major depressive disorder, recurrent episode, severe (HCC) [F33.2] 04/14/2015    Priority: High  . Alcohol abuse with intoxication (HCC) [F10.129]   . Suicidal ideation [R45.851]   . 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 Alcohol abuse [F10.10] 04/14/2015   History of Present Illness: Erin Sullivan, 25 y.o. female. Patient reports driving herself to the Emergency department because of thoughts of wanting to kill self.  She expressed that she did not feel safe alone and that she had suicidal thoughts.  She reports a history of previous attempt by overdose 3 years ago but she did not seek treatment or tell her family. She is of [place] heritage and afraid to share this with her family.  She states that she has not sought any mental health treatment in the past for mental health or substance abuse. Patient denies homicidal ideations, A/VH, and other self injurious behaviors. Patient reports a family history of mental illness, brother struggles with hallucinations, mother is bipolar with past suicide attempts, 2 cousins killed themselves by hanging, and her grandfather attempted murder suicide although did not kill her grandmother.   Today when asked about SI, she states "I don't want to commit suicide but it's better if I'm not around."  Per chart review, patient reported drinking alcohol(vodka) 3-4 times a week, started drinking at age 7, and she noticed her drinking getting out of control at age 66 but currently denies a need for treatment.When she was seen today, she was visibly anxious and flat affect.  She was also teary eyed during the  assessment.  She is reluctant at the possibility of medications but understands the that she needs the help.  Associated Signs/Symptoms: Depression Symptoms:  depressed mood, hopelessness, anxiety, (Hypo) Manic Symptoms:  Irritable Mood, Anxiety Symptoms:  Excessive Worry, Psychotic Symptoms:  NA PTSD Symptoms: Family members  Total Time spent with patient: 30 minutes  Past Psychiatric History:  SEE HPI  Risk to Self: Is patient at risk for suicide?: No Risk to Others:   Prior Inpatient Therapy:   Prior Outpatient Therapy:    Alcohol Screening: 1. How often do you have a drink containing alcohol?: 4 or more times a week 2. How many drinks containing alcohol do you have on a typical day when you are drinking?: 10 or more 3. How often do you have six or more drinks on one occasion?: Daily or almost daily Preliminary Score: 8 4. How often during the last year have you found that you were not able to stop drinking once you had started?: Weekly 5. How often during the last year have you failed to do what was normally expected from you becasue of drinking?: Weekly 6. How often during the last year have you needed a first drink in the morning to get yourself going after a heavy drinking session?: Weekly 7. How often during the last year have you had a feeling of guilt of remorse after drinking?: Weekly 8. How often during the last year have you been unable to remember what happened the night before because you had been drinking?: Weekly 9. Have you or someone else been injured as a result of your drinking?: No 10. Has a relative or friend or a  doctor or another health worker been concerned about your drinking or suggested you cut down?: No Alcohol Use Disorder Identification Test Final Score (AUDIT): 27 Brief Intervention: Yes Substance Abuse History in the last 12 months:  Yes.   Consequences of Substance Abuse: NA Previous Psychotropic Medications: No  Psychological Evaluations:   Yes Past Medical History:  Past Medical History  Diagnosis Date  . Anxiety   . Depression     Past Surgical History  Procedure Laterality Date  . Birthmark removal      from posterior leg   Family History: History reviewed. No pertinent family history. Family Psychiatric  History:  Signifocant.  See HPI Social History:  History  Alcohol Use  . Yes    Comment: drinks   one cup 4 times week or less     History  Drug Use No    Comment: denied all dlrug use    Social History   Social History  . Marital Status: Single    Spouse Name: N/A  . Number of Children: N/A  . Years of Education: N/A   Social History Main Topics  . Smoking status: Never Smoker   . Smokeless tobacco: None  . Alcohol Use: Yes     Comment: drinks   one cup 4 times week or less  . Drug Use: No     Comment: denied all dlrug use  . Sexual Activity: Yes    Birth Control/ Protection: None   Other Topics Concern  . 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, Work / School Withdrawal Symptoms: Other (Comment) (anxiety, depression, panic) Name of Substance 1: alcohol 1 - Age of First Use: 25 yrs old 1 - Amount (size/oz): one cup 4 x week or less since age 42 yrs 1 - Frequency: 4 times week 1 - Duration: since age 24 yrs old 1 - Last Use / Amount: couple days ago  Allergies:  No Known Allergies Lab Results:  Results for orders placed or performed during the hospital encounter of 04/14/15 (from the past 71 hour(s))  Comprehensive metabolic panel     Status: Abnormal   Collection Time: 04/14/15  4:40 AM  Result Value Ref Range   Sodium 145 135 - 145 mmol/L   Potassium 3.9 3.5 - 5.1 mmol/L   Chloride 113 (H) 101 - 111 mmol/L   CO2 20 (L) 22 - 32 mmol/L   Glucose, Bld 71 65  - 99 mg/dL   BUN 11 6 - 20 mg/dL   Creatinine, Ser 0.59 0.44 - 1.00 mg/dL   Calcium 9.4 8.9 - 10.3 mg/dL   Total Protein 7.6 6.5 - 8.1 g/dL   Albumin 4.5 3.5 - 5.0 g/dL   AST 24 15 - 41 U/L   ALT 17 14 - 54 U/L   Alkaline Phosphatase 64 38 - 126 U/L   Total Bilirubin 0.4 0.3 - 1.2 mg/dL   GFR calc non Af Amer >60 >60 mL/min   GFR calc Af Amer >60 >60 mL/min    Comment: (NOTE) The eGFR has been calculated using the CKD EPI equation. This calculation has not been validated in all clinical situations. eGFR's persistently <60 mL/min signify possible Chronic Kidney Disease.    Anion gap 12 5 - 15  Ethanol (ETOH)  Status: Abnormal   Collection Time: 04/14/15  4:40 AM  Result Value Ref Range   Alcohol, Ethyl (B) 61 (H) <5 mg/dL    Comment:        LOWEST DETECTABLE LIMIT FOR SERUM ALCOHOL IS 5 mg/dL FOR MEDICAL PURPOSES ONLY   Salicylate level     Status: None   Collection Time: 04/14/15  4:40 AM  Result Value Ref Range   Salicylate Lvl <4.7 2.8 - 30.0 mg/dL  Acetaminophen level     Status: Abnormal   Collection Time: 04/14/15  4:40 AM  Result Value Ref Range   Acetaminophen (Tylenol), Serum <10 (L) 10 - 30 ug/mL    Comment:        THERAPEUTIC CONCENTRATIONS VARY SIGNIFICANTLY. A RANGE OF 10-30 ug/mL MAY BE AN EFFECTIVE CONCENTRATION FOR MANY PATIENTS. HOWEVER, SOME ARE BEST TREATED AT CONCENTRATIONS OUTSIDE THIS RANGE. ACETAMINOPHEN CONCENTRATIONS >150 ug/mL AT 4 HOURS AFTER INGESTION AND >50 ug/mL AT 12 HOURS AFTER INGESTION ARE OFTEN ASSOCIATED WITH TOXIC REACTIONS.   CBC     Status: None   Collection Time: 04/14/15  4:40 AM  Result Value Ref Range   WBC 7.3 4.0 - 10.5 K/uL   RBC 4.63 3.87 - 5.11 MIL/uL   Hemoglobin 13.7 12.0 - 15.0 g/dL   HCT 42.3 36.0 - 46.0 %   MCV 91.4 78.0 - 100.0 fL   MCH 29.6 26.0 - 34.0 pg   MCHC 32.4 30.0 - 36.0 g/dL   RDW 13.4 11.5 - 15.5 %   Platelets 294 150 - 400 K/uL  I-Stat beta hCG blood, ED ([facility], [facility], [facility] only)     Status:  None   Collection Time: 04/14/15  4:52 AM  Result Value Ref Range   I-stat hCG, quantitative <5.0 <5 mIU/mL   Comment 3            Comment:   GEST. AGE      CONC.  (mIU/mL)   <=1 WEEK        5 - 50     2 WEEKS       50 - 500     3 WEEKS       100 - 10,000     4 WEEKS     1,000 - 30,000        FEMALE AND NON-PREGNANT FEMALE:     LESS THAN 5 mIU/mL   Urine rapid drug screen (hosp performed) (Not at [facility])     Status: None   Collection Time: 04/14/15  5:59 AM  Result Value Ref Range   Opiates NONE DETECTED NONE DETECTED   Cocaine NONE DETECTED NONE DETECTED   Benzodiazepines NONE DETECTED NONE DETECTED   Amphetamines NONE DETECTED NONE DETECTED   Tetrahydrocannabinol NONE DETECTED NONE DETECTED   Barbiturates NONE DETECTED NONE DETECTED    Comment:        DRUG SCREEN FOR MEDICAL PURPOSES ONLY.  IF CONFIRMATION IS NEEDED FOR ANY PURPOSE, NOTIFY LAB WITHIN 5 DAYS.        LOWEST DETECTABLE LIMITS FOR URINE DRUG SCREEN Drug Class       Cutoff (ng/mL) Amphetamine      1000 Barbiturate      200 Benzodiazepine   096 Tricyclics       283 Opiates          300 Cocaine          300 THC              50  Metabolic Disorder Labs:  No results found for: HGBA1C, MPG No results found for: PROLACTIN No results found for: CHOL, TRIG, HDL, CHOLHDL, VLDL, LDLCALC  Current Medications: Current Facility-Administered Medications  Medication Dose Route Frequency Provider Last Rate Last Dose  . acetaminophen (TYLENOL) tablet 650 mg  650 mg Oral Q6H PRN Patrecia Pour, NP      . alum & mag hydroxide-simeth (MAALOX/MYLANTA) 200-200-20 MG/5ML suspension 30 mL  30 mL Oral Q4H PRN Patrecia Pour, NP      . ARIPiprazole (ABILIFY) tablet 2 mg  2 mg Oral Daily Kerrie Buffalo, NP      . Derrill Memo ON 04/16/2015] FLUoxetine (PROZAC) capsule 20 mg  20 mg Oral Daily Kerrie Buffalo, NP      . magnesium hydroxide (MILK OF MAGNESIA) suspension 30 mL  30 mL Oral Daily PRN Patrecia Pour, NP      . ondansetron  (ZOFRAN) tablet 4 mg  4 mg Oral Q8H PRN Patrecia Pour, NP      . traZODone (DESYREL) tablet 50 mg  50 mg Oral QHS Patrecia Pour, NP   50 mg at 04/14/15 2244   PTA Medications: No prescriptions prior to admission    Musculoskeletal: Strength & Muscle Tone: within normal limits Gait & Station: normal Patient leans: N/A  Psychiatric Specialty Exam: Physical Exam  Vitals reviewed.   Review of Systems  Psychiatric/Behavioral: Positive for depression and suicidal ideas. The patient is nervous/anxious.     Blood pressure 105/69, pulse 97, temperature 97.6 F (36.4 C), temperature source Oral, resp. rate 16, height '5\' 4"'$  (1.626 m), weight 47.628 kg (105 lb), last menstrual period 03/13/2015, SpO2 99 %.Body mass index is 18.01 kg/(m^2).   General Appearance: Neat  Eye Contact:: Good  Speech: Normal Rate  Volume: Normal  Mood: Anxious and Euthymic  Affect: Appropriate  Thought Process: Coherent  Orientation: Full (Time, Place, and Person)  Thought Content: Rumination  Suicidal Thoughts: Yes without intent  Homicidal Thoughts: No  Memory: Immediate; Fair Recent; Fair Remote; Fair  Judgement: Fair  Insight: Fair  Psychomotor Activity: Normal  Concentration: Good  Recall: Good  Fund of Knowledge:Good  Language: Good  Akathisia: Negative  Handed: Right  AIMS (if indicated):    Assets: Desire for Improvement Resilience Social Support  ADL's: Intact  Cognition: WNL  Sleep: Number of Hours: 5       Treatment Plan Summary: Prozac 20 mg daily for depression Abilify 2 mg for mood stabilization Trazodone 50 mg insomnia  Observation Level/Precautions:  15 minute checks  Laboratory:  per ED  Psychotherapy:  Group therapy  Medications:  As per medlist  Consultations:  psych  Discharge Concerns:  safety    Estimated LOS:  3-7 days  Other:     I certify that inpatient services furnished can reasonably be expected to  improve the patient's condition.   Freda Munro May Agustin AGNP-BC 1/7/20176:38 PM  Patient seen face-to-face for psychiatric evaluation, case discussed with the staff and nurse practitioner and completed admission suicide risk assessment. Formulated treatment plan and reviewed the information documented and agree with the treatment plan.  Calena Salem,JANARDHAHA R. 04/16/2015 11:53 AM

## 2015-04-15 NOTE — Progress Notes (Signed)
Adult Psychoeducational Group Note  Date:  04/15/2015 Time:  10:21 PM  Group Topic/Focus:  Wrap-Up Group:   The focus of this group is to help patients review their daily goal of treatment and discuss progress on daily workbooks.  Participation Level:  Active  Participation Quality:  Appropriate and Attentive  Affect:  Appropriate  Cognitive:  Appropriate  Insight: Appropriate and Good  Engagement in Group:  Engaged  Modes of Intervention:  Education  Additional Comments:  Pt had a good day. Pt goal for tomorrow is to be more energetic and to think positive thoughts.   Merlinda FrederickKeshia S Simrah Chatham 04/15/2015, 10:21 PM

## 2015-04-16 DIAGNOSIS — F332 Major depressive disorder, recurrent severe without psychotic features: Principal | ICD-10-CM

## 2015-04-16 DIAGNOSIS — R45851 Suicidal ideations: Secondary | ICD-10-CM

## 2015-04-16 MED ORDER — ENSURE ENLIVE PO LIQD: 237.0000 mL | Freq: Two times a day (BID) | ORAL | Status: DC

## 2015-04-16 NOTE — Progress Notes (Signed)
Patient ID: Erin Sullivan, female   DOB: [birth year], 25 y.o.   MRN: [id number]   Adult Psychoeducational Group Note  Date:  04/16/2015 Time:  6:30 PM  Group Topic/Focus:  healthy support systems  Participation Level:  Did Not Attend  Participation Quality: n/a  Affect: n/a  Cognitive:  n/a  Insight: n/a  Engagement in Group: n/a  Modes of Intervention:  Activity, Discussion, Education and Support  Additional Comments:  Pt did not attend group. Pt in bed asleep.  Aurora Maskwyman, Ollie Esty E 04/16/2015, 6:30 PM

## 2015-04-16 NOTE — Progress Notes (Signed)
Patient ID: Erin Sullivan, female   DOB: 1990-06-15, 25 y.o.   MRN: [id number]  Pt currently presents with a flat affect and sullen behavior. Pr remains in bed this morning, pt reports no dizziness this morning but endorses some nausea and vomiting. Pt attends groups this afternoon and shows insight into crisis. Pt also states "I feel better."   Pt provided with medications per providers orders. Pt's labs and vitals were monitored throughout the day. Pt supported emotionally and encouraged to express concerns and questions. Pt educated on medications.  Pt's safety ensured with 15 minute and environmental checks. Pt currently denies SI/HI and A/V hallucinations. Pt verbally agrees to seek staff if SI/HI or A/VH occurs and to consult with staff before acting on these thoughts. Will continue POC.

## 2015-04-16 NOTE — Progress Notes (Signed)
[facility] MD Progress Note  04/16/2015 6:08 AM Jeanell Mangan  MRN:  [id number]   Subjective:  Erin Sullivan, 25 y.o. female. Patient reports driving herself to the Emergency department because of thoughts of wanting to kill self. She expressed that she did not feel safe alone and that she had suicidal thoughts. She reports a history of previous attempt by overdose 3 years ago but she did not seek treatment or tell her family.  Patient  is slightly improved with her mood.  Although her affect is still flat, she did verbalize a more positive outlook with life and how the meds will further help her.    Principal Problem: Major depressive disorder, recurrent episode, severe (HCC) Diagnosis:   Patient Active Problem List   Diagnosis Date Noted  . Major depressive disorder, recurrent episode, severe (HCC) [F33.2] 04/14/2015    Priority: High  . Alcohol abuse with intoxication (HCC) [F10.129]   . Suicidal ideation [R45.851]   . 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 Alcohol abuse [F10.10] 04/14/2015   Total Time spent with patient: 30 minutes  Past Psychiatric History:  depression  Past Medical History:  Past Medical History  Diagnosis Date  . Anxiety   . Depression     Past Surgical History  Procedure Laterality Date  . Birthmark removal      from posterior leg   Family History: History reviewed. No pertinent family history. Family Psychiatric  History:  Mother - depression, suicide - grandfather and bipolar disorder - brother Social History:  History  Alcohol Use  . Yes    Comment: drinks   one cup 4 times week or less     History  Drug Use No    Comment: denied all dlrug use    Social History   Social History  . Marital Status: Single    Spouse Name: N/A  . Number of Children: N/A  . Years of Education: N/A   Social History Main Topics  . Smoking status: Never Smoker   . Smokeless tobacco: None  . Alcohol Use: Yes    Comment: drinks   one cup 4 times week or less  . Drug Use: No     Comment: denied all dlrug use  . Sexual Activity: Yes    Birth Control/ Protection: None   Other Topics Concern  . 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 acetaminophen (TYLENOL) tablet 650 mg  650 mg Oral Q6H PRN Charm Rings, NP      . alum & mag hydroxide-simeth (MAALOX/MYLANTA) 200-200-20 MG/5ML suspension 30 mL  30 mL Oral Q4H PRN Charm Rings, NP      . ARIPiprazole (ABILIFY) tablet 2 mg  2 mg Oral Daily Adonis Brook, NP   2  mg at 04/15/15 2008  . FLUoxetine (PROZAC) capsule 20 mg  20 mg Oral Daily Adonis BrookSheila Agustin, NP      . magnesium hydroxide (MILK OF MAGNESIA) suspension 30 mL  30 mL Oral Daily PRN Charm RingsJamison Y Lord, NP      . ondansetron [facility](ZOFRAN) tablet 4 mg  4 mg Oral Q8H PRN Charm RingsJamison Y Lord, NP      . traZODone (DESYREL) tablet 50 mg  50 mg Oral QHS Charm RingsJamison Y Lord, NP   50 mg at 04/15/15 2155    Lab Results: No results found for this or any previous visit (from the past 48 hour(s)).  Physical Findings: AIMS: Facial and Oral Movements Muscles of Facial Expression: None, normal Lips and Perioral Area: None, normal Jaw: None, normal Tongue: None, normal,Extremity Movements Upper (arms, wrists, hands, fingers):  None, normal Lower (legs, knees, ankles, toes): None, normal, Trunk Movements Neck, shoulders, hips: None, normal, Overall Severity Severity of abnormal movements (highest score from questions above): None, normal Incapacitation due to abnormal movements: None, normal Patient's awareness of abnormal movements (rate only patient's report): No Awareness, Dental Status Current problems with teeth and/or dentures?: No Does patient usually wear dentures?: No  CIWA:  CIWA-Ar Total: 1 COWS:  COWS Total Score: 1  Musculoskeletal: Strength & Muscle Tone: within normal limits Gait & Station: normal Patient leans: N/A  Psychiatric Specialty Exam: Review of Systems  Psychiatric/Behavioral: Positive for depression. The patient is nervous/anxious.   All other systems reviewed and are negative.   Blood pressure 105/69, pulse 97, temperature 97.6 F (36.4 C), temperature source Oral, resp. rate 16, height 5\' 4"  (1.626 m), weight 47.628 kg (105 lb), last menstrual period 03/13/2015, SpO2 99 %.Body mass index is 18.01 kg/(m^2).   General Appearance: Neat  Eye Contact:: Good  Speech: Normal Rate  Volume: Normal  Mood: Anxious and Euthymic  Affect: Appropriate  Thought Process: Coherent  Orientation: Full (Time, Place, and Person)  Thought Content: Rumination  Suicidal Thoughts: Yes without intent  Homicidal Thoughts: No  Memory: Immediate; Fair Recent; Fair Remote; Fair  Judgement: Fair  Insight: Fair  Psychomotor Activity: Normal  Concentration: Good  Recall: Good  Fund of Knowledge:Good  Language: Good  Akathisia: Negative  Handed: Right  AIMS (if indicated):   Assets: Desire for Improvement Resilience Social Support  ADL's: Intact  Cognition: WNL  Sleep: Number of Hours: 5           Treatment Plan Summary:  Continue Prozac 20 mg daily for depression Continue Abilify 2 mg for mood  stabilization Continue Trazodone 50 mg insomnia  Velna HatchetSheila May Agustin AGNP-BC 04/16/2015, 6:08 AM  Reviewed the information documented and agree with the treatment plan.  Tadao Emig,JANARDHAHA R. 04/17/2015 11:00 AM

## 2015-04-16 NOTE — [facility] Group Notes (Signed)
[facility] Group Notes: (Clinical Social Work)   04/16/2015      Type of Therapy:  Group Therapy   Participation Level:  Did Not Attend despite MHT prompting   Erin MantleMareida Grossman-Orr, LCSW 04/16/2015, 12:21 PM

## 2015-04-16 NOTE — Progress Notes (Signed)
Patient has been up in the dayroom off and on but little to no interaction with peers. Writer spoke with her 1:1 and she reports that she is trying to work on thinking more positive and not to over think things so much. She c/o having trouble sleeping since being here and was informed of medication available with a repeat if needed. She was moved to [address] hall but will still program on 400 because of room mate issues. She reported to Clinical research associatewriter that her room mate has started to act weirder since this morning. She deneis si/hi/a/v hallucinations.

## 2015-04-16 NOTE — Plan of Care (Signed)
Problem: Diagnosis: Increased Risk For Suicide Attempt Goal: STG-Patient Will Comply With Medication Regime Outcome: Progressing Patient is compliant with scheduled medications. 04/15/15

## 2015-04-16 NOTE — [facility] Counselor (Signed)
Adult Comprehensive Assessment  Patient ID: Erin Sullivan, female   DOB: Jan 16, 1991, 25 y.o.   MRN: [id number]  Information Source: Information source: Patient  Current Stressors:  Educational / Learning stressors: I feel like I have difficulty leanring - focusing and remembering Employment / Job issues: Currently on a leave of abscence, so not working right now. Currently a Production designer, theatre/television/filmmanager at [employer] but wants to do something new but scared. Family Relationships: No Financial / Lack of resources (include bankruptcy): Somewhat. Boyfriend ttakes care of carnote and insurance. Currently lives with sister Housing / Lack of housing: Lives with sister Physical health (include injuries & life threatening diseases): No Social relationships: No Substance abuse: No, I drink sometimes but I don't feel like it's a problem. Bereavement / Loss: No  Living/Environment/Situation:  Living Arrangements: Other relatives Living conditions (as described by patient or guardian): Pretty good How long has patient lived in current situation?: 5 months What is atmosphere in current home: Supportive (but at times uncomfortable because of sister's bf. Patient does not like him personally.)  Family History:  Marital status: Single Are you sexually active?: Yes What is your sexual orientation?: heterosexual Has your sexual activity been affected by drugs, alcohol, medication, or emotional stress?: no Does patient have children?: No  Childhood History:  By whom was/is the patient raised?: Both parents ([place] years [place] parents [place], then she movedhe and was with parents) Description of patient's relationship with caregiver when they were a child: pretty good Patient's description of current relationship with people who raised him/her: good still How were you disciplined when you got in trouble as a child/adolescent?: couldn't leave the house Does patient have siblings?: Yes Number of  Siblings: 3 Description of patient's current relationship with siblings: 1 sister, a little brother and a half sister in [place] Did patient suffer any verbal/emotional/physical/sexual abuse as a child?: No Did patient suffer from severe childhood neglect?: No Has patient ever been sexually abused/assaulted/raped as an adolescent or adult?: No Was the patient ever a victim of a crime or a disaster?: No Witnessed domestic violence?: Yes Has patient been effected by domestic violence as an adult?: No  Education:  Highest grade of school patient has completed: Some college Currently a Consulting civil engineerstudent?: No Learning disability?: No  Employment/Work Situation:   Patient's job has been impacted by current illness: Yes Describe how patient's job has been impacted: Very low energy, quiet, physically looking like something is wrong What is the longest time patient has a held a job?: 7 Has patient ever been in the [employer] Compan[employer]ilitary?: No  Financial Resources:   Financial resources:  (Boyfriend helps financially) Does patient have a Lawyerrepresentative payee or guardian?: No  Alcohol/Substance Abuse:   What has been your use of drugs/alcohol within the last 12 months?: Occassional alcohol use.  If attempted suicide, did drugs/alcohol play a role in this?: No Alcohol/Substance Abuse Treatment Hx: Denies past history Has alcohol/substance abuse ever caused legal problems?: No  Social Support System:   Patient's Community Support System: Fair Describe Community Support System: Has support from family but I'm jusdged a lot by them, without them knowing. They judge me a lot without realizing it.  Type of faith/religion: Catholic How does patient's faith help to cope with current illness?: rarely  Leisure/Recreation:   Leisure and Hobbies: dance, salsa, listen to music, likes to cook and clean and run sometimes  Strengths/Needs:   What things does the patient do well?: Open minded In what  areas does patient struggle /  problems for patient: Low self esteem/ low self confidence  Discharge Plan:   Does patient have access to transportation?: Yes (car is across the street at [employer]) Will patient be returning to same living situation after discharge?: Yes Currently receiving community mental health services: No If no, would patient like referral for services when discharged?: Yes (What county?) [facility] county) Does patient have financial barriers related to discharge medications?: No (Thniks boyfriend would get it for her. )  Summary/Recommendations:   Summary and Recommendations (to be completed by the evaluator): 25 year old female patient states that she has been depressed since middle school but has finally identified that she needs help and support. Patient has a good family support system including her boyfriend and sister (whom she lives with). Patient states that she is open and willing to participate in treatment . Upon discharge family will provide transport and boyfriend will pay for medication as needed.  Nathen May J. 04/16/2015

## 2015-04-16 NOTE — Progress Notes (Signed)
Adult Psychoeducational Group Note  Date:  04/16/2015 Time:  9:22 PM  Group Topic/Focus:  Wrap-Up Group:   The focus of this group is to help patients review their daily goal of treatment and discuss progress on daily workbooks.  Participation Level:  Active  Participation Quality:  Appropriate  Affect:  Appropriate  Cognitive:  Appropriate  Insight: Appropriate  Engagement in Group:  Engaged  Modes of Intervention:  Discussion  Additional Comments:  Pt stated her goal for today was to eat better. Pt stated she ate more than yesterday. Pt stated one positive thing that happened today is she got to see her fiance.  Caswell Corwinwen, Wynonia Medero C 04/16/2015, 9:22 PM

## 2015-04-17 ENCOUNTER — Encounter (HOSPITAL_COMMUNITY): Payer: Self-pay | Admitting: Psychiatry

## 2015-04-17 MED ORDER — TRAZODONE HCL 50 MG PO TABS
50.0000 mg | ORAL_TABLET | Freq: Once | ORAL | Status: AC
  Administered 2015-04-17: 50 mg via ORAL
  Filled 2015-04-17: qty 1

## 2015-04-17 MED ORDER — GUAIFENESIN 100 MG/5ML PO SOLN
10.0000 mL | Freq: Four times a day (QID) | ORAL | Status: DC | PRN
  Administered 2015-04-17: 200 mg via ORAL
  Filled 2015-04-17: qty 10

## 2015-04-17 NOTE — Progress Notes (Signed)
Pt attended spiritual care group on grief and loss facilitated by counseling intern Samara DeistKathryn Ernesto Zukowski. Group opened with brief discussion and psycho-social ed around grief and loss in relationships and in relation to self - identifying life patterns, circumstances, changes that cause losses. Established group norm of speaking from own life experience. Group goal of establishing open and affirming space for members to share loss and experience with grief, normalize grief experience and provide psycho social education and grief support. Group members processed their feelings of grief and loss and the emotions attached to their grief experiences.   Pt was alert and oriented x4 with flat affect and depressed mood. Pt was an active participant in group. Pt states that her feelings of grief and loss are related to the loss of her childhood. She indicates that she did not get to experience a typical childhood and feels that her mother did not encourage her to try new things. She feels that she has very low self-esteem and lacks self-worth as a result. Pt discussed her feelings of hopelessness and worthlessness, stating that she does not feel confident or happy. When asked what does make her feel confident or hopeful, the pt stated that she enjoys reading "The Secret" because it reinforces her control over her environment and ability to see the world in a positive light, therefore attracting more positive experiences and self view. Counseling intern discussed what it was like to share in the group have others share a positive regard with her, and pt stated that it felt good.   Graciela HusbandsKathryn Jasmina Gendron Counseling Intern

## 2015-04-17 NOTE — Progress Notes (Signed)
Adult Psychoeducational Group Note  Date:  04/17/2015 Time:  9:41 PM  Group Topic/Focus:  Wrap-Up Group:   The focus of this group is to help patients review their daily goal of treatment and discuss progress on daily workbooks.  Participation Level:  Active  Participation Quality:  Appropriate and Attentive  Affect:  Appropriate  Cognitive:  Appropriate  Insight: Appropriate  Engagement in Group:  Engaged  Modes of Intervention:  Discussion  Additional Comments:  Pt stated her goal for today was to say positive things to herself to be happy, which she did and she had a good day. Pt stated she talked to the doctor and felt much better and she is to discharge tomorrow.  Caswell CorwinOwen, Tonye Tancredi C 04/17/2015, 9:41 PM

## 2015-04-17 NOTE — Tx Team (Signed)
Interdisciplinary Treatment Plan Update (Adult)  Date:  04/17/2015  Time Reviewed:  3:03 PM   Progress in Treatment: Attending groups: Yes. Participating in groups:  Yes. Taking medication as prescribed:  Yes. Tolerating medication:  Yes. Family/Significant othe contact made: SPE completed with pt's sister.  Patient understands diagnosis:  Yes. and As evidenced by:  seeking treatment for SI, depression, alcohol abuse, and for medication stabilization. Discussing patient identified problems/goals with staff:  Yes. Medical problems stabilized or resolved:  Yes. Denies suicidal/homicidal ideation: Yes. Issues/concerns per patient self-inventory:  Other:  Discharge Plan or Barriers: Pt plans to return home to her sister's, and will follow-up at [facility] in [facility], [place] for med management/counseling/mental health services.   Reason for Continuation of Hospitalization: Depression Medication stabilization  Comments:  Erin Sullivan is an 25 y.o. female. Patient reports driving herself to the Emergency department because of thoughts of wanting to kill self. Patient continues to endorse suicidal thoughts with a plan to cut self. "I didn't feel safe alone, like I would kill myself". Patient reports a previous attempt by overdose 3 years ago but she did not seek treatment or tell her family. Patient does not want her family to know she is here seeking treatment. Patient denies any inpatient or outpatient treatment in the past for mental health or substance abuse. Patient denies homicidal ideations, A/VH, and other self injurious behaviors. Patient reports a family history of mental health diagnosis unknown to her at this time. Patient reports her brother struggles with hallucinations, mother is bipolar with past suicide attempts, 2 cousins killed themselves by hanging, and her grandfather attempted murder suicide although did not kill her grandmother. Patient reports drinking alcohol(vodka) 3-4  times a week, started drinking at age 67, and she noticed her drinking getting out of control at age 44 but currently denies a need for treatment. Patient currently recognize a need for current mental health treatment. Diagnosis: Major Depressive Disorder, single episode, severe  Estimated length of stay:  3-5 days   New goal(s): to develop effective after care plan.   Additional Comments:  Patient and CSW reviewed pt's identified goals and treatment plan. Patient verbalized understanding and agreed to treatment plan. CSW reviewed [facility] "Discharge Process and Patient Involvement" Form. Pt verbalized understanding of information provided and signed form.    Review of initial/current patient goals per problem list:  1. Goal(s): Patient will participate in aftercare plan  Met: Yes  Target date: at discharge  As evidenced by: Patient will participate within aftercare plan AEB aftercare provider and housing plan at discharge being identified.  1/9: Pt plans to return home and follow-up with [facility] for medication management/counseling.   2. Goal (s): Patient will exhibit decreased depressive symptoms and suicidal ideations.  Met: Yes   Target date: at discharge  As evidenced by: Patient will utilize self rating of depression at 3 or below and demonstrate decreased signs of depression or be deemed stable for discharge by MD.  1/9: Pt rates depression as low and denies SI/HI/AVH.   3. Goal(s): Patient will demonstrate decreased signs of withdrawal due to substance abuse  Met: Yes  Target date:at discharge   As evidenced by: Patient will produce a CIWA/COWS score of 0, have stable vitals signs, and no symptoms of withdrawal.  1/9: Pt reports no signs of withdrawal with CIWA/COWS of 0 and stable vitals.   Attendees: Patient:   04/17/2015 3:03 PM   Family:   04/17/2015 3:03 PM   Physician:  Dr. Geralyn Flash  Sabra Heck, MD 04/17/2015 3:03 PM   Nursing:   Natale Milch RN 04/17/2015 3:03 PM    Clinical Social Worker: Maxie Better, LCSW 04/17/2015 3:03 PM   Clinical Social Worker:Lauren Madie Reno 04/17/2015 3:03 PM   Other:  Gerline Legacy Nurse Case Manager 04/17/2015 3:03 PM   Other:  Agustina Caroli NP 04/17/2015 3:03 PM   Other:   04/17/2015 3:03 PM   Other:  04/17/2015 3:03 PM   Other:  04/17/2015 3:03 PM   Other:  04/17/2015 3:03 PM    04/17/2015 3:03 PM    04/17/2015 3:03 PM    04/17/2015 3:03 PM    04/17/2015 3:03 PM    Scribe for Treatment Team:   Maxie Better, LCSW 04/17/2015 3:03 PM

## 2015-04-17 NOTE — Plan of Care (Signed)
Problem: Diagnosis: Increased Risk For Suicide Attempt Goal: STG-Patient Will Attend All Groups On The Unit Outcome: Progressing Pt attended evening group on 04/16/15     

## 2015-04-17 NOTE — Progress Notes (Addendum)
Patient ID: Erin Sullivan, female   DOB: Dec 18, 1990, 25 y.o.   MRN: [id number]   Pt currently presents with a flat affect and depressed behavior. Per self inventory, pt rates depression at a 4, hopelessness 4 and anxiety 5. Pt's daily goal is to "think more positive thoughts and hopefully leave soon" and they intend to do so by "I will say good things about myself and not belittle myself." Pt reports poor sleep, a fair appetite, normal energy and good concentration. Pt denies any dizziness this morning, main complaint is nausea. Pt attends groups on the unit.   Pt provided with medications per providers orders. Pt's labs and vitals were monitored throughout the day. Pt supported emotionally and encouraged to express concerns and questions. Pt educated on medications.  Pt's safety ensured with 15 minute and environmental checks. Pt currently denies SI/HI and A/V hallucinations. Pt verbally agrees to seek staff if SI/HI or A/VH occurs and to consult with staff before acting on these thoughts. Pt affect brighter today. Will continue POC.

## 2015-04-17 NOTE — Progress Notes (Signed)
D: Pt has depressed affect and mood.  She reports her day was "pretty good."  Pt reports her goal today was "to eat more so I can physically feel better."  Pt reports she accomplished this goal.  Pt reports she had a good visit with her fiance tonight.  Pt denies SI/HI, denies hallucinations, denies pain.  Pt has been visible in milieu interacting with peers and staff appropriately.  Pt attended evening group.   A: Introduced self to pt.  Met with pt 1:1 and provided support and encouragement.  Actively listened to pt.  Ensure ordered and pt informed, pt verbalized understanding.  Medications administered per order.  On-call provider informed that pt was still unable to sleep after scheduled HS medication and Trazodone 50 mg POX1 was ordered and administered.   R: Pt is compliant with medications.  Pt verbally contracts for safety.  Will continue to monitor and assess.

## 2015-04-17 NOTE — [facility] Group Notes (Signed)
[facility] LCSW Group Therapy  04/17/2015 1:27 PM  Type of Therapy:  Group Therapy  Participation Level:  Active  Participation Quality:  Attentive  Affect:  Appropriate  Cognitive:  Alert and Oriented  Insight:  Improving  Engagement in Therapy:  Improving  Modes of Intervention:  Confrontation, Discussion, Education, Exploration, Problem-solving, Rapport Building, Socialization and Support  Summary of Progress/Problems: Today's Topic: Overcoming Obstacles. Patients identified one short term goal and potential obstacles in reaching this goal. Patients processed barriers involved in overcoming these obstacles. Patients identified steps necessary for overcoming these obstacles and explored motivation (internal and external) for facing these difficulties head on. Cielle was attentive and engaged during today's processing group. She shared that she plans to d/c tomorrow and states that her biggest obstacle is managing her SI sx. Pt states that she is happy with the medication regimen and plans [facility].   Smart, Kimberlynn Lumbra LCSW 04/17/2015, 1:27 PM

## 2015-04-17 NOTE — [facility] Suicide Risk Assessment (Signed)
[facility] INPATIENT:  Family/Significant Other Suicide Prevention Education  Suicide Prevention Education:  Education Completed; Erin LloydMilka Sullivan, Pt's sister [phone],  has been identified by the patient as the family member/significant other with whom the patient will be residing, and identified as the person(s) who will aid the patient in the event of a mental health crisis (suicidal ideations/suicide attempt).  With written consent from the patient, the family member/significant other has been provided the following suicide prevention education, prior to the and/or following the discharge of the patient.  The suicide prevention education provided includes the following:  Suicide risk factors  Suicide prevention and interventions  National Suicide Hotline telephone number  [facility] assessment telephone number  [facility] Emergency Assistance 911  [facility] and/or Residential Mobile Crisis Unit telephone number  Request made of family/significant other to:  Remove weapons (e.g., guns, rifles, knives), all items previously/currently identified as safety concern.    Remove drugs/medications (over-the-counter, prescriptions, illicit drugs), all items previously/currently identified as a safety concern.  The family member/significant other verbalizes understanding of the suicide prevention education information provided.  The family member/significant other agrees to remove the items of safety concern listed above.  Chad Cordialarter, Solangel Mcmanaway M 04/17/2015, 1:32 PM

## 2015-04-17 NOTE — Progress Notes (Signed)
[facility]  04/17/2015 1:14 PM Erin LewRosanna Sullivan  MRN:  [id number] Subjective:  Erin DickerRosanna states that she has been struggling with depression for some time now but did not seek out treatment. She admits she is under quite some stress coming from the relationship with her fiancee who seems to be rigid and controlling, having quit her  job and alternatives not having panned out, staying at her sisters house where she is having a hard time the way the father of her sisters's 2 kids behaves towards her sister and how her sister enables him. She admits she is very empathetic keeps everything in and has poor self esteem. This has contributed to her at times wishing she was dead. She has not received treatment for depression until now. She has a strong family history of mood disorderes Principal Problem: Major depressive disorder, recurrent episode, severe (HCC) Diagnosis:   Patient Active Problem List   Diagnosis Date Noted  . Alcohol abuse with intoxication (HCC) [F10.129]   . Suicidal ideation [R45.851]   . 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 Alcohol abuse [F10.10] 04/14/2015  . Major depressive disorder, recurrent episode, severe (HCC) [F33.2] 04/14/2015   Total Time spent with patient: 30 minutes  Past Psychiatric History: Denies previous treatment  Past Medical History:  Past Medical History  Diagnosis Date  . Anxiety   . Depression     Past Surgical History  Procedure Laterality Date  . Birthmark removal      from posterior leg   Family History: History reviewed. No pertinent family history. Family Psychiatric  History: See Admission H and P: 2 cousins committed suicide, has other family members with Bipolar Disorder Social History:  History  Alcohol Use  . Yes    Comment: drinks   one cup 4 times week or less     History  Drug Use No    Comment: denied all dlrug use    Social History   Social History  . Marital  Status: Single    Spouse Name: N/A  . Number of Children: N/A  . Years of Education: N/A   Social History Main Topics  . Smoking status: Never Smoker   . Smokeless tobacco: None  . Alcohol Use: Yes     Comment: drinks   one cup 4 times week or less  . Drug Use: No     Comment: denied all dlrug use  . Sexual Activity: Yes    Birth Control/ Protection: None   Other Topics Concern  . None   Social History Narrative   Additional Social History:    Pain Medications: none Prescriptions: none Over the Counter: none History of alcohol / drug use?: Yes Longest period of sobriety (when/how long): several weeks Negative Consequences of Use: Surveyor, quantityinancial, Work / Programmer, multimediachool Withdrawal Symptoms: Other (Comment) (anxiety, depression, panic) Name of Substance 1: alcohol 1 - Age of First Use: 25 yrs old 1 - Amount (size/oz): one cup 4 x week or less since age 25 yrs 1 - Frequency: 4 times week 1 - Duration: since age 25 yrs old 1 - Last Use / Amount: couple days ago                  Sleep: Fair  Appetite:  Fair  Current Medications: Current Facility-Administered Medications  Medication Dose Route Frequency Provider Last Rate Last Dose  . acetaminophen (TYLENOL) tablet 650 mg  650 mg Oral Q6H PRN Charm RingsJamison Y Lord, NP      .  alum & mag hydroxide-simeth (MAALOX/MYLANTA) 200-200-20 MG/5ML suspension 30 mL  30 mL Oral Q4H PRN Charm Rings, NP      . ARIPiprazole (ABILIFY) tablet 2 mg  2 mg Oral Daily Adonis Brook, NP   2 mg at 04/17/15 0823  . feeding supplement (ENSURE ENLIVE) (ENSURE ENLIVE) liquid 237 mL  237 mL Oral BID BM Rockey Situ Cobos, MD   237 mL at 04/17/15 1000  . FLUoxetine (PROZAC) capsule 20 mg  20 mg Oral Daily Adonis Brook, NP   20 mg at 04/17/15 0823  . magnesium hydroxide (MILK OF MAGNESIA) suspension 30 mL  30 mL Oral Daily PRN Charm Rings, NP      . ondansetron [facility]) tablet 4 mg  4 mg Oral Q8H PRN Charm Rings, NP   4 mg at 04/17/15 0825  . traZODone  (DESYREL) tablet 50 mg  50 mg Oral QHS Charm Rings, NP   50 mg at 04/16/15 2108    Lab Results: No results found for this or any previous visit (from the past 48 hour(s)).  Physical Findings: AIMS: Facial and Oral Movements Muscles of Facial Expression: None, normal Lips and Perioral Area: None, normal Jaw: None, normal Tongue: None, normal,Extremity Movements Upper (arms, wrists, hands, fingers): None, normal Lower (legs, knees, ankles, toes): None, normal, Trunk Movements Neck, shoulders, hips: None, normal, Overall Severity Severity of abnormal movements (highest score from questions above): None, normal Incapacitation due to abnormal movements: None, normal Patient's awareness of abnormal movements (rate only patient's report): No Awareness, Dental Status Current problems with teeth and/or dentures?: No Does patient usually wear dentures?: No  CIWA:  CIWA-Ar Total: 1 COWS:  COWS Total Score: 1  Musculoskeletal: Strength & Muscle Tone: within normal limits Gait & Station: normal Patient leans: normal  Psychiatric Specialty Exam: Review of Systems  Cardiovascular: Positive for chest pain.  Gastrointestinal: Negative.   Neurological: Positive for headaches.  Psychiatric/Behavioral: Positive for depression. The patient is nervous/anxious.     Blood pressure 108/72, pulse 102, temperature 97.5 F (36.4 C), temperature source Oral, resp. rate 16, height 5\' 4"  (1.626 m), weight 47.628 kg (105 lb), last menstrual period 03/13/2015, SpO2 99 %.Body mass index is 18.01 kg/(m^2).  General Appearance: Fairly Groomed  Patent attorney::  Fair  Speech:  Clear and Coherent  Volume:  Normal  Mood:  Anxious and Depressed  Affect:  Restricted  Thought Process:  Coherent and Goal Directed  Orientation:  Full (Time, Place, and Person)  Thought Content:  symptoms events worries concerns  Suicidal Thoughts:  No  Homicidal Thoughts:  No  Memory:  Immediate;   Fair Recent;   Fair Remote;    Fair  Judgement:  Fair  Insight:  Present and Shallow  Psychomotor Activity:  Decreased  Concentration:  Fair  Recall:  [employer] of Knowledge:Fair  Language: Fair  Akathisia:  No  Handed:  Right  AIMS (if indicated):     Assets:  Desire for Improvement Housing Social Support  ADL's:  Intact  Cognition: WNL  Sleep:  Number of Hours: 5.25   Treatment Plan Summary: Daily contact with patient to assess and evaluate symptoms and progress in treatment and Medication management Supportive approach/coping skills Alcohol abuse; work a relapse prevention plan, work on finding healthier ways of coping Depression; continue the Prozac 20 mg daily with Abilify augmentation Work with CBT/mindfulness Mickala Laton A 04/17/2015, 1:14 PM

## 2015-04-17 NOTE — Progress Notes (Signed)
Recreation Therapy Notes  Date: 01.09.2017 Time: 9:30am Location: [address] Dayroom   Group Topic: Stress Management  Goal Area(s) Addresses:  Patient will actively participate in stress management techniques presented during session.   Behavioral Response: Did not attend.    Bronte Sabado L Leeandre Nordling, LRT/CTRS        Ryen Heitmeyer L 04/17/2015 3:00 PM 

## 2015-04-17 NOTE — [facility] Group Notes (Signed)
[facility]   04/17/2015 9:40 AM  Participation Quality:  Invited. DID NOT ATTEND. Chose to remain in bed.   Smart, Julissa Browning LCSW

## 2015-04-18 DIAGNOSIS — F332 Major depressive disorder, recurrent severe without psychotic features: Secondary | ICD-10-CM | POA: Insufficient documentation

## 2015-04-18 MED ORDER — TRAZODONE HCL 50 MG PO TABS
50.0000 mg | ORAL_TABLET | Freq: Every evening | ORAL | Status: DC | PRN
  Filled 2015-04-18: qty 7

## 2015-04-18 MED ORDER — TRAZODONE HCL 50 MG PO TABS: 50.0000 mg | ORAL_TABLET | Freq: Every evening | ORAL | Status: DC | PRN

## 2015-04-18 MED ORDER — TRAZODONE HCL 50 MG PO TABS
50.0000 mg | ORAL_TABLET | Freq: Every evening | ORAL | Status: DC | PRN
  Administered 2015-04-18: 50 mg via ORAL
  Filled 2015-04-18 (×2): qty 14
  Filled 2015-04-18 (×3): qty 1

## 2015-04-18 MED ORDER — ARIPIPRAZOLE 2 MG PO TABS
2.0000 mg | ORAL_TABLET | Freq: Every day | ORAL | Status: DC
Start: 2015-04-18 — End: 2017-05-08

## 2015-04-18 MED ORDER — FLUOXETINE HCL 20 MG PO CAPS: 20.0000 mg | ORAL_CAPSULE | Freq: Every day | ORAL | Status: DC

## 2015-04-18 NOTE — Progress Notes (Signed)
  [facility] Discharge [facility] :  Will you be returning to the same living situation after discharge:  Yes,  home with sister At discharge, do you have transportation home?: Yes,  sister Do you have the ability to pay for your medications: Yes,  mental health  Release of information consent forms completed and submitted to medical records by CSW.  Patient to Follow up at: Follow-up Information    Follow up with [facility].   Why:  Walk in between 8:30AM-3PM for hospital follow-up/medication management/assessment for counseling services.    Contact information:   [address] [place], [place] [address] Phone: [phone] Fax: [phone]      Next level of care provider has access to [facility] Link:no  Safety Planning and Suicide Prevention discussed: Yes,  SPE completed with pt's sister. Pt provided with SPI pamphlet, mobile crisis information.   Have you used any form of tobacco in the last 30 days? (Cigarettes, Smokeless Tobacco, Cigars, and/or Pipes): No  Has patient been referred to the Quitline?: N/A patient is not a smoker  Patient has been referred for addiction treatment: Yes-see above.   Smart, Marlea Gambill LCSW 04/18/2015, 9:48 AM

## 2015-04-18 NOTE — Progress Notes (Signed)
D: Pt has depressed affect and mood; brightens upon approach.  Pt reports her day was "awesome."  Pt reports she has "been eating well."  She reports her goal was to "say positive things to myself so I can be happier."  Pt reports she accomplished her goal.  Pt denies SI/HI, denies hallucinations, denies pain.  Pt has been visible in milieu interacting with peers and staff appropriately.  She was working on a puzzle with peers in the dayroom.  Pt attended evening group.   A: Met with pt 1:1 and provided support and encouragement.  Actively listened to pt.  On-call provider contacted and repeat dose of Trazodone 50 mg PO was ordered along with Robitussin 10 mL q6 hours PRN.  Both of these medications were administered.   R: Pt is compliant with medications.  Pt verbally contracts for safety.  Will continue to monitor and assess.

## 2015-04-18 NOTE — Discharge Summary (Signed)
Physician Discharge Summary Note  Patient:  Erin Sullivan is an 25 y.o., female MRN:  [id number] DOB:  1990-06-21 Patient phone:  [phone] (home)  Patient address:   [address] [place] [address],  Total Time spent with patient: 45 minutes  Date of Admission:  04/14/2015 Date of Discharge: 04/18/2015  Reason for Admission:   Erin Sullivan, 25 y.o. female. Patient reports driving herself to the Emergency department because of thoughts of wanting to kill self. She expressed that she did not feel safe alone and that she had suicidal thoughts. She reports a history of previous attempt by overdose 3 years ago but she did not seek treatment or tell her family. She is of [place] heritage and afraid to share this with her family. She states that she has not sought any mental health treatment in the past for mental health or substance abuse. Patient denies homicidal ideations, A/VH, and other self injurious behaviors. Patient reports a family history of mental illness, brother struggles with hallucinations, mother is bipolar with past suicide attempts, 2 cousins killed themselves by hanging, and her grandfather attempted murder suicide although did not kill her grandmother.   Today when asked about SI, she states "I don't want to commit suicide but it's better if I'm not around." Per chart review, patient reported drinking alcohol(vodka) 3-4 times a week, started drinking at age 4, and she noticed her drinking getting out of control at age 55 but currently denies a need for treatment.When she was seen today, she was visibly anxious and flat affect. She was also teary eyed during the assessment. She is reluctant at the possibility of medications but understands the that she needs the help.  Principal Problem: Major depressive disorder, recurrent episode, severe [facility]) Discharge Diagnoses: Patient Active Problem List   Diagnosis Date Noted  . Alcohol abuse with intoxication  (HCC) [F10.129]   . Suicidal ideation [R45.851]   . 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 Alcohol abuse [F10.10] 04/14/2015  . Major depressive disorder, recurrent episode, severe (HCC) [F33.2] 04/14/2015    Past Psychiatric History: See H&P  Past Medical History:  Past Medical History  Diagnosis Date  . Anxiety   . Depression     Past Surgical History  Procedure Laterality Date  . Birthmark removal      from posterior leg   Family History: History reviewed. No pertinent family history. Family Psychiatric  History: See H&P Social History:  History  Alcohol Use  . Yes    Comment: drinks   one cup 4 times week or less     History  Drug Use No    Comment: denied all dlrug use    Social History   Social History  . Marital Status: Single    Spouse Name: N/A  . Number of Children: N/A  . Years of Education: N/A   Social History Main Topics  . Smoking status: Never Smoker   . Smokeless tobacco: None  . Alcohol Use: Yes     Comment: drinks   one cup 4 times week or less  . Drug Use: No     Comment: denied all dlrug use  . Sexual Activity: Yes    Birth Control/ Protection: None   Other Topics Concern  . None   Social History Narrative    Hospital Course:   Cristine Daw was admitted for Major depressive disorder, recurrent episode, severe (HCC), and crisis management.  Pt was treated discharged with  the medications listed below under Medication List.  Medical problems were identified and treated as needed.  Home medications were restarted as appropriate.  Improvement was monitored by observation and Erin Sullivan 's daily report of symptom reduction.  Emotional and mental status was monitored by daily self-inventory reports completed by Erin Sullivan and clinical staff.         Erin Sullivan was evaluated by the treatment team for stability and plans for continued recovery upon discharge. Erin Sullivan 's  motivation was an integral factor for scheduling further treatment. Employment, transportation, bed availability, health status, family support, and any pending legal issues were also considered during hospital stay. Pt was offered further treatment options upon discharge including but not limited to Residential, Intensive Outpatient, and Outpatient treatment.  Erin Sullivan will follow up with the services as listed below under Follow Up Information.     Upon completion of this admission the patient was both mentally and medically stable for discharge denying suicidal/homicidal ideation, auditory/visual/tactile hallucinations, delusional thoughts and paranoia.    Erin Lewosanna Sullivan responded well to treatment with Abilify, Prozac, and trazodone without adverse effects. Pt demonstrated improvement without reported or observed adverse effects to the point of stability appropriate for outpatient management. Pertinent labs include: BAL 61. Reviewed CBC, CMP, BAL, and UDS; all unremarkable aside from noted exceptions.   Physical Findings: AIMS: Facial and Oral Movements Muscles of Facial Expression: None, normal Lips and Perioral Area: None, normal Jaw: None, normal Tongue: None, normal,Extremity Movements Upper (arms, wrists, hands, fingers): None, normal Lower (legs, knees, ankles, toes): None, normal, Trunk Movements Neck, shoulders, hips: None, normal, Overall Severity Severity of abnormal movements (highest score from questions above): None, normal Incapacitation due to abnormal movements: None, normal Patient's awareness of abnormal movements (rate only patient's report): No Awareness, Dental Status Current problems with teeth and/or dentures?: No Does patient usually wear dentures?: No  CIWA:  CIWA-Ar Total: 1 COWS:  COWS Total Score: 1  Musculoskeletal: Strength & Muscle Tone: within normal limits Gait & Station: normal Patient leans: N/A  Psychiatric Specialty Exam: Review of Systems   Psychiatric/Behavioral: Positive for depression and substance abuse. Negative for suicidal ideas and hallucinations. The patient is nervous/anxious and has insomnia.   All other systems reviewed and are negative.   Blood pressure 102/70, pulse 94, temperature 97.7 F (36.5 C), temperature source Oral, resp. rate 16, height 5\' 4"  (1.626 m), weight 47.628 kg (105 lb), last menstrual period 03/13/2015, SpO2 99 %.Body mass index is 18.01 kg/(m^2).  SEE MD PSE within the SRA   Have you used any form of tobacco in the last 30 days? (Cigarettes, Smokeless Tobacco, Cigars, and/or Pipes): No  Has this patient used any form of tobacco in the last 30 days? (Cigarettes, Smokeless Tobacco, Cigars, and/or Pipes) Yes, No  Metabolic Disorder Labs:  No results found for: HGBA1C, MPG No results found for: PROLACTIN No results found for: CHOL, TRIG, HDL, CHOLHDL, VLDL, LDLCALC  See Psychiatric Specialty Exam and Suicide Risk Assessment completed by Attending Physician prior to discharge.  Discharge destination:  Home  Is patient on multiple antipsychotic therapies at discharge:  No   Has Patient had three or more failed trials of antipsychotic monotherapy by history:  No  Recommended Plan for Multiple Antipsychotic Therapies: NA     Medication List    TAKE these medications      Indication   ARIPiprazole 2 MG tablet  Commonly known as:  ABILIFY  Take 1 tablet (2 mg total) by  mouth daily.   Indication:  mood instability     FLUoxetine 20 MG capsule  Commonly known as:  PROZAC  Take 1 capsule (20 mg total) by mouth daily.   Indication:  Depression     traZODone 50 MG tablet  Commonly known as:  DESYREL  Take 1 tablet (50 mg total) by mouth at bedtime as needed for sleep.   Indication:  Trouble Sleeping           Follow-up Information    Follow up with [facility].   Why:  Walk in between 8:30AM-3PM for hospital follow-up/medication management/assessment for counseling services.    Contact  information:   [address] [place], [place] [address] Phone: [phone] Fax: [phone]      Follow-up recommendations:  Activity:  As tolerated Diet:  Heart healthy with low sodium.  Comments:   Take all medications as prescribed. Keep all follow-up appointments as scheduled.  Do not consume alcohol or use illegal drugs while on prescription medications. Report any adverse effects from your medications to your primary care provider promptly.  In the event of recurrent symptoms or worsening symptoms, call 911, a crisis hotline, or go to the nearest emergency department for evaluation.   Signed: Beau Fanny, FNP-BC 04/18/2015, 12:48 PM   I personally assessed the patient and formulated the plan Madie Reno A. Dub Mikes, M.D.

## 2015-04-18 NOTE — Plan of Care (Signed)
Problem: Alteration in mood Goal: LTG-Patient reports reduction in suicidal thoughts (Patient reports reduction in suicidal thoughts and is able to verbalize a safety plan for whenever patient is feeling suicidal)  Outcome: Progressing Pt denies SI and verbally contracts for safety.       

## 2015-04-18 NOTE — BHH Suicide Risk Assessment (Signed)
[facility] Discharge Suicide Risk Assessment   Demographic Factors:  Adolescent or young adult  Total Time spent with patient: 20 minutes  Musculoskeletal: Strength & Muscle Tone: within normal limits Gait & Station: normal Patient leans: normal  Psychiatric Specialty Exam: Physical Exam  Review of Systems  Constitutional: Negative.   HENT: Negative.   Eyes: Negative.   Respiratory: Negative.   Cardiovascular: Negative.   Gastrointestinal: Negative.   Genitourinary: Negative.   Musculoskeletal: Negative.   Skin: Negative.   Neurological: Negative.   Endo/Heme/Allergies: Negative.   Psychiatric/Behavioral: Positive for depression and substance abuse.    Blood pressure 102/70, pulse 94, temperature 97.7 F (36.5 C), temperature source Oral, resp. rate 16, height 5\' 4"  (1.626 m), weight 47.628 kg (105 lb), last menstrual period 03/13/2015, SpO2 99 %.Body mass index is 18.01 kg/(m^2).  General Appearance: Fairly Groomed  Patent attorney::  Fair  Speech:  Clear and Coherent409  Volume:  Normal  Mood:  Euthymic  Affect:  Appropriate  Thought Process:  Coherent and Goal Directed  Orientation:  Full (Time, Place, and Person)  Thought Content:  plans as she moves on  Suicidal Thoughts:  No  Homicidal Thoughts:  No  Memory:  Immediate;   Fair Recent;   Fair Remote;   Fair  Judgement:  Fair  Insight:  Present  Psychomotor Activity:  Normal  Concentration:  Fair  Recall:  [employer] of Knowledge:Fair  Language: Fair  Akathisia:  No  Handed:  Right  AIMS (if indicated):     Assets:  Desire for Improvement Housing  Sleep:  Number of Hours: 4.5  Cognition: WNL  ADL's:  Intact   Have you used any form of tobacco in the last 30 days? (Cigarettes, Smokeless Tobacco, Cigars, and/or Pipes): No  Has this patient used any form of tobacco in the last 30 days? (Cigarettes, Smokeless Tobacco, Cigars, and/or Pipes) No  Mental Status Per Nursing Assessment::   On Admission:  Suicidal  ideation indicated by patient  Current Mental Status by Physician: In full contact with reality. There are no active SI plans or intent. There are no active S/S of withdrawal. She is aware that she has to make some decisions in terms of her relationship with her BF. States that he is very controlling and jealous and not wanting her to do things that she enjoys like dancing. In his culture women are more subdued. She is also aware that she is going to have to deal with her sister's kids father while she stays at her sister house. Also endorses some stress from having to get a new job ASAP. She is aware of all this but she is hopeful that she is going to be able to deal with all this as she continues her medications, therapy and abstains from drinking alcohol   Loss Factors: Financial problems/change in socioeconomic status  Historical Factors: NA  Risk Reduction Factors:   Sense of responsibility to family, Living with another person, especially a relative and Positive social support  Continued Clinical Symptoms:  Depression:   Comorbid alcohol abuse/dependence  Cognitive Features That Contribute To Risk:  None    Suicide Risk:  Minimal: No identifiable suicidal ideation.  Patients presenting with no risk factors but with morbid ruminations; may be classified as minimal risk based on the severity of the depressive symptoms  Principal Problem: Major depressive disorder, recurrent episode, severe [facility]) Discharge Diagnoses:  Patient Active Problem List   Diagnosis Date Noted  . Alcohol abuse with  intoxication (HCC) [F10.129]   . Suicidal ideation [R45.851]   . 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 Alcohol abuse [F10.10] 04/14/2015  . Major depressive disorder, recurrent episode, severe (HCC) [F33.2] 04/14/2015    Follow-up Information    Follow up with [facility].   Why:  Walk in between 8:30AM-3PM for hospital follow-up/medication  management/assessment for counseling services.    Contact information:   [address] [place], [place] [address] Phone: [phone] Fax: [phone]      Plan Of Care/Follow-up recommendations:  Activity:  as tolerated Diet:  regular Follow up [facility] Is patient on multiple antipsychotic therapies at discharge:  No   Has Patient had three or more failed trials of antipsychotic monotherapy by history:  No  Recommended Plan for Multiple Antipsychotic Therapies: NA    Samarra Ridgely A 04/18/2015, 12:46 PM

## 2017-02-28 ENCOUNTER — Encounter (HOSPITAL_COMMUNITY): Payer: Self-pay

## 2017-02-28 ENCOUNTER — Inpatient Hospital Stay (HOSPITAL_COMMUNITY)
Admission: AD | Admit: 2017-02-28 | Discharge: 2017-02-28 | Disposition: A | Discharge status: Home / Self Care | Payer: BLUE CROSS/BLUE SHIELD | Source: Ambulatory Visit | Attending: Obstetrics and Gynecology | Admitting: Obstetrics and Gynecology

## 2017-02-28 DIAGNOSIS — N911 Secondary amenorrhea: Secondary | ICD-10-CM | POA: Diagnosis not present

## 2017-02-28 DIAGNOSIS — N912 Amenorrhea, unspecified: Secondary | ICD-10-CM | POA: Diagnosis present

## 2017-02-28 NOTE — MAU Note (Signed)
Patient presents with 2 positive home pregnancy tests, LMP 01/23/17, denies pain, no bleeding.

## 2017-02-28 NOTE — MAU Provider Note (Signed)
Ms. Erin Sullivan is a 26 y.o. G1P0 who present to MAU today for pregnancy confirmation. She denies abdominal pain or vaginal bleeding.   BP (!) 114/58 (BP Location: Right Arm)   Pulse 78   Temp 98.3 F (36.8 C)   Resp 16   LMP 01/23/2017  CONSTITUTIONAL: Well-developed, well-nourished female in no acute distress.  CARDIOVASCULAR: Normal heart rate noted RESPIRATORY: Effort and breath sounds normal GASTROINTESTINAL:Soft, no distention noted.  No tenderness, rebound or guarding.  SKIN: Skin is warm and dry. No rash noted. Not diaphoretic. No erythema. No pallor. PSYCHIATRIC: Normal mood and affect. Normal behavior. Normal judgment and thought content.  MDM Medical screening exam complete Patient does not endorse any symptoms concerning for ectopic pregnancy or pregnancy related complication today.   A:  Amenorrhea  P: Discharge home Patient advised that she can present as a walk-in to [facility] for a pregnancy test M-Th between 8am-4pm or Friday between 8am -11am Reasons to return to MAU reviewed  Patient may return to MAU as needed or if her condition were to change or worsen  Kendell BaneLeftwich-Kirby, Dylan Ruotolo A, CNM 02/28/2017 7:35 PM

## 2017-03-04 ENCOUNTER — Encounter: Payer: Self-pay | Admitting: General Practice

## 2017-03-04 ENCOUNTER — Telehealth: Payer: Self-pay | Admitting: General Practice

## 2017-03-04 ENCOUNTER — Ambulatory Visit: Payer: Self-pay | Admitting: General Practice

## 2017-03-04 DIAGNOSIS — Z3201 Encounter for pregnancy test, result positive: Secondary | ICD-10-CM

## 2017-03-04 LAB — POCT PREGNANCY, URINE: Preg Test, Ur: POSITIVE — AB

## 2017-03-04 NOTE — Progress Notes (Signed)
Patient here for UPT today. UPT +. Patient reports first positive home test 02/27/17. LMP 01/23/17 EDD 10/30/17 [id number]. Patient denies taking any medications only PNV. Patient asked about care with her cat at home. Told patient cats are not a problem we just don't want her changing the litter. Patient verbalized understanding & would like to start care here. New OB packet given & recommended she make an appt upon leaving. Pregnancy verification letter given. Patient verbalized understanding & had no other questions.

## 2017-03-04 NOTE — Telephone Encounter (Signed)
Patient had positive pregnancy test today.  She is 5d5w and  will need to be scheduled in January 2019.  Asked patient to give our office a call in the next week or two to schedule New OB appointment.  Patient voiced understanding.

## 2017-03-04 NOTE — Progress Notes (Signed)
Agree with nursing staff's documentation of this patient's clinic encounter.  Vonzella NippleJulie Kirstyn Lean, PA-C 03/04/2017 11:35 AM

## 2017-04-08 NOTE — L&D Delivery Note (Signed)
Delivery Note After IOL for postdates with cervical foley, Pit and AROM as induction methods, pt became complete at 1623 and labored down x 3hrs, pushed well x 30 mins, and at 8:27 PM a viable female was delivered via Vaginal, Spontaneous (Presentation: OA).  APGAR: 8, 9; weight 6 lb 13 oz (3090 g). Tight double nuchal cords reduced after delivery with somersault method. Infant dried and placed on pt's abd; cord clamped and cut by FOB; hospital cord blood sample collected. Placenta status: spont, intact .  Cord: 3 vessel  Anesthesia:  Epidural Episiotomy: None Lacerations: R labial/sidewall lac Suture Repair: 4.0 monocryl; 4 interrupted sutures Est. Blood Loss (mL): 297  Mom to postpartum.  Baby to Couplet care / Skin to Skin.  Erin Sullivan, Erin Sullivan CNM 10/31/2017, 10:13 PM  Please schedule this patient for Postpartum visit in: 4 weeks with the following provider: Any provider For C/S patients schedule nurse incision check in weeks 2 weeks: no Low risk pregnancy complicated by: none Delivery mode:  SVD Anticipated Birth Control:  POPs PP Procedures needed: Colpo  Schedule Integrated BH visit: offer to pt since she has hx of depression

## 2017-04-15 ENCOUNTER — Encounter: Payer: Self-pay | Admitting: Obstetrics and Gynecology

## 2017-04-15 ENCOUNTER — Ambulatory Visit (INDEPENDENT_AMBULATORY_CARE_PROVIDER_SITE_OTHER): Payer: Medicaid Other | Admitting: Obstetrics and Gynecology

## 2017-04-15 ENCOUNTER — Ambulatory Visit (INDEPENDENT_AMBULATORY_CARE_PROVIDER_SITE_OTHER): Payer: Self-pay | Admitting: Clinical

## 2017-04-15 VITALS — BP 112/62 | HR 66 | Wt 110.0 lb

## 2017-04-15 DIAGNOSIS — F1010 Alcohol abuse, uncomplicated: Secondary | ICD-10-CM

## 2017-04-15 DIAGNOSIS — O0991 Supervision of high risk pregnancy, unspecified, first trimester: Secondary | ICD-10-CM | POA: Diagnosis not present

## 2017-04-15 DIAGNOSIS — Z124 Encounter for screening for malignant neoplasm of cervix: Secondary | ICD-10-CM | POA: Diagnosis not present

## 2017-04-15 DIAGNOSIS — Z113 Encounter for screening for infections with a predominantly sexual mode of transmission: Secondary | ICD-10-CM

## 2017-04-15 DIAGNOSIS — F319 Bipolar disorder, unspecified: Secondary | ICD-10-CM

## 2017-04-15 DIAGNOSIS — Z3480 Encounter for supervision of other normal pregnancy, unspecified trimester: Secondary | ICD-10-CM

## 2017-04-15 DIAGNOSIS — F1011 Alcohol abuse, in remission: Secondary | ICD-10-CM

## 2017-04-15 DIAGNOSIS — Z8759 Personal history of other complications of pregnancy, childbirth and the puerperium: Secondary | ICD-10-CM

## 2017-04-15 DIAGNOSIS — Z8659 Personal history of other mental and behavioral disorders: Secondary | ICD-10-CM | POA: Insufficient documentation

## 2017-04-15 DIAGNOSIS — Z87898 Personal history of other specified conditions: Secondary | ICD-10-CM

## 2017-04-15 NOTE — BH Specialist Note (Signed)
Integrated Behavioral Health Initial Visit  MRN: [id number] Name: Erin Sullivan  Number of Integrated Behavioral Health Clinician visits:: 1/6 Session Start time: 10:20  Session End time: 10:40 Total time: 20 minutes  Type of Service: Integrated Behavioral Health- Individual/Family Interpretor:No. Interpretor Name and Language: n/a   Warm Hand Off Completed.       SUBJECTIVE: Erin Sullivan is a 27 y.o. female accompanied by Friend Patient was referred by Dr Robley Friesasch for history of BH issues. Patient reports the following symptoms/concerns: Pt states her primary concern today is feeling nauseous and anxious, fatigue, lack of sleep, and lack of appetite. Pt states she was [facility] BH [facility], but did not take after leaving the hospital, is not currently being treated; does not want medication at this time. Pt denies any current SI or alcohol use. 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 Reduce symptoms of: anxiety 2. Increase knowledge and/or ability of: self-management skills  3. Demonstrate ability to: Increase healthy adjustment to current life circumstances  INTERVENTIONS: Interventions utilized: Sleep Hygiene and Psychoeducation and/or Health Education  Standardized Assessments completed: GAD-7 and PHQ 9  ASSESSMENT: Patient currently experiencing History of depression, history of alcohol abuse, history of suicidal ideation.   Patient may benefit from psychoeducation and brief therapeutic intervention regarding maintaining reduced symptoms of anxiety and  depression, and improving sleep quality.  PLAN: 1. Follow up with behavioral health clinician on : one month 2. Behavioral recommendations:  -Consider spending 10 minutes/day outside, prior to going in to work, every morning -Consider using sleep sound and other apps as additional self-coping strategy -Consider reading educational material regarding coping with symptoms of anxiety and depression 3. Referral(s): [facility] 4. "From scale of 1-10, how likely are you to follow plan?": 7  Rae LipsJamie C McMannes, LCSW  Dep[facility] screen [facility] 04/15/2017  Decreased Interest 0  Down, Depressed, Hopeless 0  PHQ - 2 Score 0  Altered sleeping 2  Tired, decreased energy 3  Change in appetite 1  Feeling bad or failure about yourself  0  Trouble concentrating 1  Moving slowly or fidgety/restless 0  Suicidal thoughts 0  PHQ-9 Score 7   GAD 7 : Generalized Anxiety Score 04/15/2017  Nervous, Anxious, on Edge 1  Control/stop worrying 1  Worry too much - different things 1  Trouble relaxing 1  Restless 0  Easily annoyed or irritable 0  Afraid - awful might happen 1  Total GAD 7 Score 5

## 2017-04-15 NOTE — Patient Instructions (Signed)
Childbirth Education Options: [facility] Classes:  Childbirth education classes can help you get ready for a positive parenting experience. You can also meet other expectant parents and get free stuff for your baby. Each class runs for five weeks on the same night and costs $45 for the mother-to-be and her support person. Medicaid covers the cost if you are eligible. Call [phone] to register. [facility] Childbirth Education:  [phone] or [phone] or [email or web address]  Baby & Me Class: Discuss newborn & infant parenting and family adjustment issues with other new mothers in a relaxed environment. Each week brings a new speaker or baby-centered activity. We encourage new mothers to join us every Thursday at 11:00am. Babies birth until crawling. No registration or fee. Daddy Boot Camp: This course offers Dads-to-be the tools and knowledge needed to feel confident on their journey to becoming new fathers. Experienced dads, who have been trained as coaches, teach dads-to-be how to hold, comfort, diaper, swaddle and play with their infant while being able to support the new mom as well. A class for men taught by men. $25/dad Big Brother/Big Sister: Let your children share in the joy of a new brother or sister in this special class designed just for them. Class includes discussion about how families care for babies: swaddling, holding, diapering, safety as well as how they can be helpful in their new role. This class is designed for children ages 2 to 6, but any age is welcome. Please register each child individually. $5/child  Mom Talk: This mom-led group offers support and connection to mothers as they journey through the adjustments and struggles of that sometimes overwhelming first year after the birth of a child. Tuesdays at 10:00am and Thursdays at 6:00pm. Babies welcome. No registration or fee. Breastfeeding Support Group: This group is a mother-to-mother  support circle where moms have the opportunity to share their breastfeeding experiences. A Lactation Consultant is present for questions and concerns. Meets each Tuesday at 11:00am. No fee or registration. Breastfeeding Your Baby: Learn what to expect in the first days of breastfeeding your newborn.  This class will help you feel more confident with the skills needed to begin your breastfeeding experience. Many new mothers are concerned about breastfeeding after leaving the hospital. This class will also address the most common fears and challenges about breastfeeding during the first few weeks, months and beyond. (call for fee) Comfort Techniques and Tour: This 2 hour interactive class will provide you the opportunity to learn & practice hands-on techniques that can help relieve some of the discomfort of labor and encourage your baby to rotate toward the best position for birth. You and your partner will be able to try a variety of labor positions with birth balls and rebozos as well as practice breathing, relaxation, and visualization techniques. A tour of the [facility] is included with this class. $20 per registrant and support person Childbirth Class- Weekend Option: This class is a Weekend version of our Birth & Baby series. It is designed for parents who have a difficult time fitting several weeks of classes into their schedule. It covers the care of your newborn and the basics of labor and childbirth. It also includes a Maternity Care Center Tour of [facility] and lunch. The class is held two consecutive days: beginning on Friday evening from 6:30 - 8:30 p.m. and the next day, Saturday from 9 a.m. - 4 p.m. (call for fee) Waterbirth Class: Interested in a waterbirth?  This   informational class will help you discover whether waterbirth is the right fit for you. Education about waterbirth itself, supplies you would need and how to assemble your support team is what you can  expect from this class. Some obstetrical practices require this class in order to pursue a waterbirth. (Not all obstetrical practices offer waterbirth-check with your healthcare provider.) Register only the expectant mom, but you are encouraged to bring your partner to class! Required if planning waterbirth, no fee. Infant/Child CPR: Parents, grandparents, babysitters, and friends learn Cardio-Pulmonary Resuscitation skills for infants and children. You will also learn how to treat both conscious and unconscious choking in infants and children. This Family & Friends program does not offer certification. Register each participant individually to ensure that enough mannequins are available. (Call for fee) Grandparent Love: Expecting a grandbaby? This class is for you! Learn about the latest infant care and safety recommendations and ways to support your own child as he or she transitions into the parenting role. Taught by Registered Nurses who are childbirth instructors, but most importantly...they are grandmothers too! $10/person. Childbirth Class- Natural Childbirth: This series of 5 weekly classes is for expectant parents who want to learn and practice natural methods of coping with the process of labor and childbirth. Relaxation, breathing, massage, visualization, role of the partner, and helpful positioning are highlighted. Participants learn how to be confident in their body's ability to give birth. This class will empower and help parents make informed decisions about their own care. Includes discussion that will help new parents transition into the immediate postpartum period. Maternity Care Center Tour of [facility] is included. We suggest taking this class between 25-32 weeks, but it's only a recommendation. $75 per registrant and one support person or $30 Medicaid. Childbirth Class- 3 week Series: This option of 3 weekly classes helps you and your labor partner prepare for childbirth. Newborn  care, labor & birth, cesarean birth, pain management, and comfort techniques are discussed and a Maternity Care Center Tour of [facility] is included. The class meets at the same time, on the same day of the week for 3 consecutive weeks beginning with the starting date you choose. $60 for registrant and one support person.  Marvelous Multiples: Expecting twins, triplets, or more? This class covers the differences in labor, birth, parenting, and breastfeeding issues that face multiples' parents. NICU tour is included. Led by a Certified Childbirth Educator who is the mother of twins. No fee. Caring for Baby: This class is for expectant and adoptive parents who want to learn and practice the most up-to-date newborn care for their babies. Focus is on birth through the first six weeks of life. Topics include feeding, bathing, diapering, crying, umbilical cord care, circumcision care and safe sleep. Parents learn to recognize symptoms of illness and when to call the pediatrician. Register only the mom-to-be and your partner or support person can plan to come with you! $10 per registrant and support person Childbirth Class- online option: This online class offers you the freedom to complete a Birth and Baby series in the comfort of your own home. The flexibility of this option allows you to review sections at your own pace, at times convenient to you and your support people. It includes additional video information, animations, quizzes, and extended activities. Get organized with helpful eClass tools, checklists, and trackers. Once you register online for the class, you will receive an email within a few days to accept the invitation and begin the class when the time   is right for you. The content will be available to you for 60 days. $60 for 60 days of online access for you and your support people.  Local Doulas: Natural Baby Doulas [email or web address] Tel:  [phone] [email or web address] [place] [employer] [phone] [email or web address] The Labor Ladies  (also do waterbirth tub rental) [phone] [email or web address] [employer] [phone] [email or web address] Sacred [employer]hyth[employer]  [phone] [email or web address] [place] [employer] (PADA) [email or web address].org/index.htm [place] [facility]  [email or web address] Considering Waterbirth? Guide for patients at [facility]  Why consider waterbirth?  . Gentle birth for babies . Less pain medicine used in labor . May allow for passive descent/less pushing . May reduce perineal tears  . More mobility and instinctive maternal position changes . Increased maternal relaxation . Reduced blood pressure in labor  Is waterbirth safe? What are the risks of infection, drowning or other complications?  . Infection: o Very low risk (3.7 % for tub vs 4.8% for bed) o 7 in 8000 waterbirths with documented infection o Poorly cleaned equipment most common cause o Slightly lower group B strep transmission rate  . Drowning o Maternal:  - Very low risk   - Related to seizures or fainting o Newborn:  - Very low risk. No evidence of increased risk of respiratory problems in multiple large studies - Physiological protection from breathing under water - Avoid underwater birth if there are any fetal complications - Once baby's head is out of the water, keep it out.  . Birth complication o Some reports of cord trauma, but risk decreased by bringing baby to surface gradually o No evidence of increased risk of shoulder dystocia. Mothers can usually change positions faster in water than in a bed, possibly aiding the maneuvers to free the shoulder.   You must attend a Waterbirth class at [facility]  3rd Wednesday of every month from 7-9pm  Free  Register by calling [phone] or online at [email or web address]  Bring us the certificate from the class to your prenatal appointment  Meet with a midwife at 36 weeks to see if you can still plan a waterbirth and to sign the consent.   Purchase or rent the following supplies:   Water Birth Pool (Birth Pool in a Box or [place] for instance)  (Tubs start ~$125)  Single-use disposable tub liner designed for your brand of tub  New garden hose labeled "lead-free", "suitable for drinking water",  Electric drain pump to remove water (We recommend 792 gallon per hour or greater pump.)   Separate garden hose to remove the dirty water  Fish net  Bathing suit top (optional)  Long-handled mirror (optional)  Places to purchase or rent supplies  [email or web address] for tub purchases and supplies  Waterbirthsolutions.com for tub purchases and supplies  The Labor Ladies ([email or web address]) $275 for tub rental/set-up & take down/kit   [place] [employer] (http://www.padanc.org/MeetUs.htm) Information regarding doulas (labor support) who provide pool rentals  Our practice has a Birth Pool in a Box tub at the hospital that you may borrow on a first-come-first-served basis. It is your responsibility to to set up, clean and break down the tub. We cannot guarantee the availability of this tub in advance. You are responsible for bringing all accessories listed above. If you do not have all necessary supplies you cannot have a waterbirth.    Things that would prevent you from having a waterbirth:  Premature, <37wks  Previous cesarean birth  Presence of thick meconium-stained fluid  Multiple gestation (Twins,   triplets, etc.)  Uncontrolled diabetes or gestational diabetes requiring medication  Hypertension requiring medication or diagnosis of pre-eclampsia  Heavy vaginal bleeding  Non-reassuring fetal  heart rate  Active infection (MRSA, etc.). Group B Strep is NOT a contraindication for  waterbirth.  If your labor has to be induced and induction method requires continuous  monitoring of the baby's heart rate  Other risks/issues identified by your obstetrical provider  Please remember that birth is unpredictable. Under certain unforeseeable circumstances your provider may advise against giving birth in the tub. These decisions will be made on a case-by-case basis and with the safety of you and your baby as our highest priority.      

## 2017-04-15 NOTE — Progress Notes (Signed)
Subjective:   Erin Sullivan is a 27 y.o. G2P0010 at [redacted]w[redacted]d by LMP being seen today for her first obstetrical visit.  Her obstetrical history is significant for Depression, alcohol abuses, suicidal ideation, termination of previous pregnancy . Patient undecided intend to breast feed. Pregnancy history fully reviewed.  Patient reports no complaints.  HISTORY: Obstetric History   G2   P0   T0   P0   A1   L0    SAB0   TAB1   Ectopic0   Multiple0   Live Births0     # Outcome Date GA Lbr Len/2nd Weight Sex Delivery Anes PTL Lv  2 Current           1 TAB              Past Medical History:  Diagnosis Date  . Anxiety   . Depression    Past Surgical History:  Procedure Laterality Date  . Birthmark Removal     from posterior leg   Family History  Problem Relation Age of Onset  . Depression Mother   . Diabetes Father    Social History   Tobacco Use  . Smoking status: Never Smoker  . Smokeless tobacco: Never Used  Substance Use Topics  . Alcohol use: Yes    Comment: drinks   one cup 4 times week or less  . Drug use: No    Comment: denied all dlrug use   No Known Allergies Current Outpatient Medications on File Prior to Visit  Medication Sig Dispense Refill  . Prenatal Multivit-Min-Fe-FA (PRENATAL VITAMINS PO) Take by mouth.    . ARIPiprazole (ABILIFY) 2 MG tablet Take 1 tablet (2 mg total) by mouth daily. (Patient not taking: Reported on 04/15/2017) 30 tablet 0  . FLUoxetine (PROZAC) 20 MG capsule Take 1 capsule (20 mg total) by mouth daily. (Patient not taking: Reported on 04/15/2017) 30 capsule 0  . traZODone (DESYREL) 50 MG tablet Take 1 tablet (50 mg total) by mouth at bedtime as needed for sleep. (Patient not taking: Reported on 04/15/2017) 30 tablet 0   No current facility-administered medications on file prior to visit.      Exam   Vitals:   04/15/17 0903  BP: 112/62  Pulse: 66  Weight: 110 lb (49.9 kg)   Fetal Heart Rate (bpm): 157   BP 112/62   Pulse 66    Wt 110 lb (49.9 kg)   LMP 01/23/2017   BMI 18.88 kg/m  Uterine Size: size equals dates  Pelvic Exam:    Perineum: No Hemorrhoids, Normal Perineum   Vulva: normal   Vagina:  normal mucosa, normal discharge, no palpable nodules   pH: Not done   Cervix: Scant amount of spotting following Pap, no cervical motion tenderness and no lesions   Adnexa: normal adnexa and no mass, fullness, tenderness   Bony Pelvis: Adequate  System: Breast:  No nipple retraction or dimpling, No nipple discharge or bleeding, No axillary or supraclavicular adenopathy, Normal to palpation without dominant masses   Skin: normal coloration and turgor, no rashes    Neurologic: negative   Extremities: normal strength, tone, and muscle mass   HEENT neck supple with midline trachea and thyroid without masses   Mouth/Teeth mucous membranes moist, pharynx normal without lesions   Neck supple and no masses   Cardiovascular: regular rate and rhythm, no murmurs or gallops   Respiratory:  appears well, vitals normal, no respiratory distress, acyanotic, normal RR, neck free  of mass or lymphadenopathy, chest clear, no wheezing, crepitations, rhonchi, normal symmetric air entry   Abdomen: soft, non-tender; bowel sounds normal; no masses,  no organomegaly   Urinary: urethral meatus normal    Assessment:   Pregnancy: G2P0010 Patient Active Problem List   Diagnosis Date Noted  . Supervision of other normal pregnancy, antepartum 04/15/2017  . History of depression 04/15/2017  . Bipolar disorder (HCC) 04/15/2017  . Severe episode of recurrent major depressive disorder, without psychotic features (HCC)   . Alcohol abuse with intoxication (HCC)   . Suicidal ideation   . Severe recurrent major depression without psychotic features (HCC) 04/14/2015  . Alcohol abuse 04/14/2015  . Major depressive disorder, recurrent episode, severe (HCC) 04/14/2015     Plan:  1. Supervision of other normal pregnancy, antepartum  -  Culture, OB Urine - Obstetric Panel, Including HIV - Ambulatory referral to Integrated Behavioral Health - Hemoglobinopathy evaluation - US MFM OB COMP + 14 WK; Future  2. History of depression  - Doing well  - Ambulatory referral to Integrated Behavioral Health   Initial labs drawn. Continue prenatal vitamins. Genetic Screening discussed, First trimester screen: requested. Ultrasound discussed; fetal anatomic survey: requested. Problem list reviewed and updated. The [facility] with multiple MDs and other Advanced Practice Providers was explained to patient; also emphasized that residents, students are part of our team. Routine obstetric precautions reviewed. Return in about 4 weeks (around 05/13/2017) for High risk OB.    Zianne Schubring, Harolyn RutherfordJennifer I, NP

## 2017-04-17 LAB — OBSTETRIC PANEL, INCLUDING HIV
Antibody Screen: NEGATIVE
BASOS ABS: 0 10*3/uL (ref 0.0–0.2)
Basos: 0 %
EOS (ABSOLUTE): 0.1 10*3/uL (ref 0.0–0.4)
Eos: 1 %
HIV Screen 4th Generation wRfx: NONREACTIVE
Hematocrit: 36.7 % (ref 34.0–46.6)
Hemoglobin: 12.2 g/dL (ref 11.1–15.9)
Hepatitis B Surface Ag: NEGATIVE
IMMATURE GRANS (ABS): 0 10*3/uL (ref 0.0–0.1)
Immature Granulocytes: 0 %
LYMPHS: 18 %
Lymphocytes Absolute: 1.7 10*3/uL (ref 0.7–3.1)
MCH: 30 pg (ref 26.6–33.0)
MCHC: 33.2 g/dL (ref 31.5–35.7)
MCV: 90 fL (ref 79–97)
Monocytes Absolute: 1 10*3/uL — ABNORMAL HIGH (ref 0.1–0.9)
Monocytes: 10 %
NEUTROS ABS: 6.8 10*3/uL (ref 1.4–7.0)
Neutrophils: 71 %
PLATELETS: 270 10*3/uL (ref 150–379)
RBC: 4.07 x10E6/uL (ref 3.77–5.28)
RDW: 13 % (ref 12.3–15.4)
RPR: NONREACTIVE
Rh Factor: POSITIVE
Rubella Antibodies, IGG: 7.18 index (ref >0.99)
WBC: 9.5 10*3/uL (ref 3.4–10.8)

## 2017-04-17 LAB — HEMOGLOBINOPATHY EVALUATION
HEMOGLOBIN A2 QUANTITATION: 2.6 % (ref 1.8–3.2)
HGB C: 0 %
HGB S: 0 %
HGB VARIANT: 0 %
Hemoglobin F Quantitation: 0 % (ref 0.0–2.0)
Hgb A: 97.4 % (ref 96.4–98.8)

## 2017-04-17 LAB — CYTOLOGY - PAP
CHLAMYDIA, DNA PROBE: POSITIVE — AB
NEISSERIA GONORRHEA: NEGATIVE

## 2017-04-18 ENCOUNTER — Telehealth: Payer: Self-pay

## 2017-04-18 ENCOUNTER — Other Ambulatory Visit: Payer: Self-pay

## 2017-04-18 DIAGNOSIS — B9689 Other specified bacterial agents as the cause of diseases classified elsewhere: Secondary | ICD-10-CM

## 2017-04-18 DIAGNOSIS — A749 Chlamydial infection, unspecified: Secondary | ICD-10-CM

## 2017-04-18 DIAGNOSIS — N760 Acute vaginitis: Principal | ICD-10-CM

## 2017-04-18 LAB — CULTURE, OB URINE

## 2017-04-18 LAB — URINE CULTURE, OB REFLEX

## 2017-04-18 MED ORDER — AZITHROMYCIN 250 MG PO TABS: 1000.0000 mg | ORAL_TABLET | Freq: Once | ORAL | 0 refills | Status: AC

## 2017-04-18 NOTE — Progress Notes (Signed)
Opened in error

## 2017-04-18 NOTE — Telephone Encounter (Signed)
Called pt to inform her of her positive Chlamydia results. Got vm. Left message for pt to call our office. Medication was sent to pharmacy. STD card was faxed to [facility].

## 2017-04-19 NOTE — Telephone Encounter (Signed)
Patient has + BV and chlamydia with an abnormal pap. Attempted to contact patient to discuss. No answer, left VM  Sindi Beckworth, Harolyn RutherfordJennifer I, NP 04/19/2017 11:16 AM

## 2017-04-22 ENCOUNTER — Encounter (HOSPITAL_COMMUNITY): Payer: Self-pay | Admitting: Obstetrics and Gynecology

## 2017-04-29 ENCOUNTER — Other Ambulatory Visit: Payer: Self-pay | Admitting: Obstetrics and Gynecology

## 2017-04-29 ENCOUNTER — Ambulatory Visit (HOSPITAL_COMMUNITY)
Admission: RE | Admit: 2017-04-29 | Discharge: 2017-04-29 | Disposition: A | Discharge status: Home / Self Care | Payer: BLUE CROSS/BLUE SHIELD | Source: Ambulatory Visit | Attending: Obstetrics and Gynecology | Admitting: Obstetrics and Gynecology

## 2017-04-29 ENCOUNTER — Encounter (HOSPITAL_COMMUNITY): Payer: Self-pay

## 2017-04-29 DIAGNOSIS — Z3A13 13 weeks gestation of pregnancy: Secondary | ICD-10-CM | POA: Diagnosis not present

## 2017-04-29 DIAGNOSIS — Z3480 Encounter for supervision of other normal pregnancy, unspecified trimester: Secondary | ICD-10-CM | POA: Diagnosis present

## 2017-04-29 DIAGNOSIS — O0991 Supervision of high risk pregnancy, unspecified, first trimester: Secondary | ICD-10-CM

## 2017-04-29 DIAGNOSIS — Z3682 Encounter for antenatal screening for nuchal translucency: Secondary | ICD-10-CM | POA: Insufficient documentation

## 2017-04-29 DIAGNOSIS — Z3481 Encounter for supervision of other normal pregnancy, first trimester: Secondary | ICD-10-CM | POA: Insufficient documentation

## 2017-04-30 ENCOUNTER — Encounter (INDEPENDENT_AMBULATORY_CARE_PROVIDER_SITE_OTHER): Payer: Medicaid Other | Admitting: General Practice

## 2017-04-30 DIAGNOSIS — A749 Chlamydial infection, unspecified: Secondary | ICD-10-CM | POA: Diagnosis not present

## 2017-04-30 DIAGNOSIS — O98812 Other maternal infectious and parasitic diseases complicating pregnancy, second trimester: Secondary | ICD-10-CM

## 2017-04-30 MED ORDER — METRONIDAZOLE 500 MG PO TABS: 500.0000 mg | ORAL_TABLET | Freq: Two times a day (BID) | ORAL | 0 refills | Status: DC

## 2017-04-30 MED ORDER — AZITHROMYCIN 250 MG PO TABS
1000.0000 mg | ORAL_TABLET | Freq: Once | ORAL | Status: AC
  Administered 2017-04-30: 1000 mg via ORAL

## 2017-04-30 NOTE — Telephone Encounter (Signed)
Patient came by office and I informed her of all results, importance of partner treatment and avoiding intercourse for 2 weeks following. Patient verbalized understanding to all & will get flagyl from pharmacy. Patient treated with zithromax in office. Patient asked about medication for colds and provided handout on approved OTC meds in pregnancy. Patient verbalized understanding & had no questions

## 2017-04-30 NOTE — Addendum Note (Signed)
Addended by: Kathee DeltonHILLMAN, Camaron Cammack L on: 04/30/2017 01:47 PM   Modules accepted: Orders

## 2017-04-30 NOTE — Telephone Encounter (Signed)
Called patient, no answer-left message on voicemail stating we are trying to reach you regarding urgent results, please call us back. Called patient's emergency contact and asked that she notify the patient to call us back regarding important results. She states she will

## 2017-05-06 ENCOUNTER — Encounter: Payer: Self-pay | Admitting: *Deleted

## 2017-05-08 ENCOUNTER — Ambulatory Visit (INDEPENDENT_AMBULATORY_CARE_PROVIDER_SITE_OTHER): Payer: Self-pay | Admitting: Obstetrics & Gynecology

## 2017-05-08 ENCOUNTER — Encounter: Payer: Self-pay | Admitting: Obstetrics & Gynecology

## 2017-05-08 VITALS — BP 111/76 | HR 82 | Wt 113.1 lb

## 2017-05-08 DIAGNOSIS — Z3480 Encounter for supervision of other normal pregnancy, unspecified trimester: Secondary | ICD-10-CM

## 2017-05-08 DIAGNOSIS — R87612 Low grade squamous intraepithelial lesion on cytologic smear of cervix (LGSIL): Secondary | ICD-10-CM | POA: Insufficient documentation

## 2017-05-08 NOTE — Patient Instructions (Signed)
Second Trimester of Pregnancy The second trimester is from week 13 through week 28, month 4 through 6. This is often the time in pregnancy that you feel your best. Often times, morning sickness has lessened or quit. You may have more energy, and you may get hungry more often. Your unborn baby (fetus) is growing rapidly. At the end of the sixth month, he or she is about 9 inches long and weighs about 1 pounds. You will likely feel the baby move (quickening) between 18 and 20 weeks of pregnancy. Follow these instructions at home:  Avoid all smoking, herbs, and alcohol. Avoid drugs not approved by your doctor.  Do not use any tobacco products, including cigarettes, chewing tobacco, and electronic cigarettes. If you need help quitting, ask your doctor. You may get counseling or other support to help you quit.  Only take medicine as told by your doctor. Some medicines are safe and some are not during pregnancy.  Exercise only as told by your doctor. Stop exercising if you start having cramps.  Eat regular, healthy meals.  Wear a good support bra if your breasts are tender.  Do not use hot tubs, steam rooms, or saunas.  Wear your seat belt when driving.  Avoid raw meat, uncooked cheese, and liter boxes and soil used by cats.  Take your prenatal vitamins.  Take 1500-2000 milligrams of calcium daily starting at the 20th week of pregnancy until you deliver your baby.  Try taking medicine that helps you poop (stool softener) as needed, and if your doctor approves. Eat more fiber by eating fresh fruit, vegetables, and whole grains. Drink enough fluids to keep your pee (urine) clear or pale yellow.  Take warm water baths (sitz baths) to soothe pain or discomfort caused by hemorrhoids. Use hemorrhoid cream if your doctor approves.  If you have puffy, bulging veins (varicose veins), wear support hose. Raise (elevate) your feet for 15 minutes, 3-4 times a day. Limit salt in your diet.  Avoid heavy  lifting, wear low heals, and sit up straight.  Rest with your legs raised if you have leg cramps or low back pain.  Visit your dentist if you have not gone during your pregnancy. Use a soft toothbrush to brush your teeth. Be gentle when you floss.  You can have sex (intercourse) unless your doctor tells you not to.  Go to your doctor visits. Get help if:  You feel dizzy.  You have mild cramps or pressure in your lower belly (abdomen).  You have a nagging pain in your belly area.  You continue to feel sick to your stomach (nauseous), throw up (vomit), or have watery poop (diarrhea).  You have bad smelling fluid coming from your vagina.  You have pain with peeing (urination). Get help right away if:  You have a fever.  You are leaking fluid from your vagina.  You have spotting or bleeding from your vagina.  You have severe belly cramping or pain.  You lose or gain weight rapidly.  You have trouble catching your breath and have chest pain.  You notice sudden or extreme puffiness (swelling) of your face, hands, ankles, feet, or legs.  You have not felt the baby move in over an hour.  You have severe headaches that do not go away with medicine.  You have vision changes. This information is not intended to replace advice given to you by your health care provider. Make sure you discuss any questions you have with your health care   provider. Document Released: 06/19/2009 Document Revised: 08/31/2015 Document Reviewed: 05/26/2012 Elsevier Interactive Patient Education  2017 Elsevier Inc.  

## 2017-05-08 NOTE — Progress Notes (Signed)
   PRENATAL VISIT NOTE  Subjective:  Erin Sullivan is a 27 y.o. G2P0010 at 5269w0d being seen today for ongoing prenatal care.  She is currently monitored for the following issues for this low-risk pregnancy and has Severe recurrent major depression without psychotic features (HCC); Alcohol abuse; Major depressive disorder, recurrent episode, severe (HCC); Alcohol abuse with intoxication (HCC); Suicidal ideation; Severe episode of recurrent major depressive disorder, without psychotic features (HCC); Supervision of other normal pregnancy, antepartum; History of depression; Bipolar disorder (HCC); and LGSIL on Pap smear of cervix on their problem list.  Patient reports nausea without vomiting. No other complaints.  Contractions: Not present. Vag. Bleeding: None.  Movement: Absent. Denies leaking of fluid.   The following portions of the patient's history were reviewed and updated as appropriate: allergies, current medications, past family history, past medical history, past social history, past surgical history and problem list. Problem list updated.  Objective:   Vitals:   05/08/17 0953  BP: 111/76  Pulse: 82  Weight: 113 lb 1.6 oz (51.3 kg)    Fetal Status: Fetal Heart Rate (bpm): 153   Movement: Absent     General:  Alert, oriented and cooperative. Patient is in no acute distress.  Skin: Skin is warm and dry. No rash noted.   Cardiovascular: Normal heart rate noted  Respiratory: Normal respiratory effort, no problems with respiration noted  Abdomen: Soft, gravid, appropriate for gestational age.  Pain/Pressure: Present     Pelvic: Cervical exam deferred        Extremities: Normal range of motion.  Edema: None  Mental Status:  Normal mood and affect. Normal behavior. Normal judgment and thought content.   Assessment and Plan:  Pregnancy: G2P0010 at 6269w0d  1. Supervision of other normal pregnancy, antepartum - Doing well today, no major concerns - Anatomy Ultrasound scheduled for  06/03/17 - AFP, Serum, Open Spina Bifida - Follow up in 4 weeks  Preterm labor symptoms and general obstetric precautions including but not limited to vaginal bleeding, contractions, leaking of fluid and fetal movement were reviewed in detail with the patient. Please refer to After Visit Summary for other counseling recommendations.  Return in about 4 weeks (around 06/05/2017).   Asa SaunasNathan J Rindi Beechy, Student-PA

## 2017-05-09 ENCOUNTER — Other Ambulatory Visit (HOSPITAL_COMMUNITY): Payer: Self-pay

## 2017-05-10 LAB — AFP, SERUM, OPEN SPINA BIFIDA
AFP MOM: 0.98
AFP Value: 30.8 ng/mL
GEST. AGE ON COLLECTION DATE: 15 wk
Maternal Age At EDD: 26.9 yr
OSBR RISK 1 IN: 10000
TEST RESULTS AFP: NEGATIVE
Weight: 113 [lb_av]

## 2017-06-03 ENCOUNTER — Other Ambulatory Visit: Payer: Self-pay | Admitting: Obstetrics and Gynecology

## 2017-06-03 ENCOUNTER — Ambulatory Visit (HOSPITAL_COMMUNITY)
Admission: RE | Admit: 2017-06-03 | Discharge: 2017-06-03 | Disposition: A | Discharge status: Home / Self Care | Payer: Medicaid Other | Source: Ambulatory Visit | Attending: Obstetrics and Gynecology | Admitting: Obstetrics and Gynecology

## 2017-06-03 DIAGNOSIS — Z3A18 18 weeks gestation of pregnancy: Secondary | ICD-10-CM

## 2017-06-03 DIAGNOSIS — Z369 Encounter for antenatal screening, unspecified: Secondary | ICD-10-CM

## 2017-06-03 DIAGNOSIS — Z363 Encounter for antenatal screening for malformations: Secondary | ICD-10-CM | POA: Diagnosis not present

## 2017-06-03 DIAGNOSIS — O0991 Supervision of high risk pregnancy, unspecified, first trimester: Secondary | ICD-10-CM

## 2017-06-03 DIAGNOSIS — Z3482 Encounter for supervision of other normal pregnancy, second trimester: Secondary | ICD-10-CM | POA: Insufficient documentation

## 2017-06-03 DIAGNOSIS — Z3689 Encounter for other specified antenatal screening: Secondary | ICD-10-CM | POA: Diagnosis present

## 2017-06-05 ENCOUNTER — Ambulatory Visit (INDEPENDENT_AMBULATORY_CARE_PROVIDER_SITE_OTHER): Payer: Medicaid Other | Admitting: Obstetrics & Gynecology

## 2017-06-05 DIAGNOSIS — Z3480 Encounter for supervision of other normal pregnancy, unspecified trimester: Secondary | ICD-10-CM

## 2017-06-05 NOTE — Progress Notes (Signed)
   PRENATAL VISIT NOTE  Subjective:  Karma LewRosanna Yzaguirre is a 27 y.o. G2P0010 at 5377w0d being seen today for ongoing prenatal care.  She is currently monitored for the following issues for this low-risk pregnancy and has Severe recurrent major depression without psychotic features (HCC); Alcohol abuse; Major depressive disorder, recurrent episode, severe (HCC); Alcohol abuse with intoxication (HCC); Suicidal ideation; Severe episode of recurrent major depressive disorder, without psychotic features (HCC); Supervision of other normal pregnancy, antepartum; History of depression; Bipolar disorder (HCC); and LGSIL on Pap smear of cervix on their problem list.  Patient reports no complaints.  Contractions: Not present. Vag. Bleeding: None.  Movement: Absent. Denies leaking of fluid.   The following portions of the patient's history were reviewed and updated as appropriate: allergies, current medications, past family history, past medical history, past social history, past surgical history and problem list. Problem list updated.  Objective:   Vitals:   06/05/17 1057  BP: (!) 108/59  Pulse: 60  Weight: 115 lb 14.4 oz (52.6 kg)    Fetal Status: Fetal Heart Rate (bpm): 142   Movement: Absent     General:  Alert, oriented and cooperative. Patient is in no acute distress.  Skin: Skin is warm and dry. No rash noted.   Cardiovascular: Normal heart rate noted  Respiratory: Normal respiratory effort, no problems with respiration noted  Abdomen: Soft, gravid, appropriate for gestational age.  Pain/Pressure: Absent     Pelvic: Cervical exam deferred        Extremities: Normal range of motion.  Edema: None  Mental Status:  Normal mood and affect. Normal behavior. Normal judgment and thought content.   Assessment and Plan:  Pregnancy: G2P0010 at 5577w0d  1. Supervision of other normal pregnancy, antepartum US result reviewed, normal  Preterm labor symptoms and general obstetric precautions including but  not limited to vaginal bleeding, contractions, leaking of fluid and fetal movement were reviewed in detail with the patient. Please refer to After Visit Summary for other counseling recommendations.  Return in about 4 weeks (around 07/03/2017).   Scheryl DarterJames Tesha Archambeau, MD

## 2017-06-05 NOTE — Patient Instructions (Signed)
Second Trimester of Pregnancy The second trimester is from week 13 through week 28, month 4 through 6. This is often the time in pregnancy that you feel your best. Often times, morning sickness has lessened or quit. You may have more energy, and you may get hungry more often. Your unborn baby (fetus) is growing rapidly. At the end of the sixth month, he or she is about 9 inches long and weighs about 1 pounds. You will likely feel the baby move (quickening) between 18 and 20 weeks of pregnancy. Follow these instructions at home:  Avoid all smoking, herbs, and alcohol. Avoid drugs not approved by your doctor.  Do not use any tobacco products, including cigarettes, chewing tobacco, and electronic cigarettes. If you need help quitting, ask your doctor. You may get counseling or other support to help you quit.  Only take medicine as told by your doctor. Some medicines are safe and some are not during pregnancy.  Exercise only as told by your doctor. Stop exercising if you start having cramps.  Eat regular, healthy meals.  Wear a good support bra if your breasts are tender.  Do not use hot tubs, steam rooms, or saunas.  Wear your seat belt when driving.  Avoid raw meat, uncooked cheese, and liter boxes and soil used by cats.  Take your prenatal vitamins.  Take 1500-2000 milligrams of calcium daily starting at the 20th week of pregnancy until you deliver your baby.  Try taking medicine that helps you poop (stool softener) as needed, and if your doctor approves. Eat more fiber by eating fresh fruit, vegetables, and whole grains. Drink enough fluids to keep your pee (urine) clear or pale yellow.  Take warm water baths (sitz baths) to soothe pain or discomfort caused by hemorrhoids. Use hemorrhoid cream if your doctor approves.  If you have puffy, bulging veins (varicose veins), wear support hose. Raise (elevate) your feet for 15 minutes, 3-4 times a day. Limit salt in your diet.  Avoid heavy  lifting, wear low heals, and sit up straight.  Rest with your legs raised if you have leg cramps or low back pain.  Visit your dentist if you have not gone during your pregnancy. Use a soft toothbrush to brush your teeth. Be gentle when you floss.  You can have sex (intercourse) unless your doctor tells you not to.  Go to your doctor visits. Get help if:  You feel dizzy.  You have mild cramps or pressure in your lower belly (abdomen).  You have a nagging pain in your belly area.  You continue to feel sick to your stomach (nauseous), throw up (vomit), or have watery poop (diarrhea).  You have bad smelling fluid coming from your vagina.  You have pain with peeing (urination). Get help right away if:  You have a fever.  You are leaking fluid from your vagina.  You have spotting or bleeding from your vagina.  You have severe belly cramping or pain.  You lose or gain weight rapidly.  You have trouble catching your breath and have chest pain.  You notice sudden or extreme puffiness (swelling) of your face, hands, ankles, feet, or legs.  You have not felt the baby move in over an hour.  You have severe headaches that do not go away with medicine.  You have vision changes. This information is not intended to replace advice given to you by your health care provider. Make sure you discuss any questions you have with your health care   provider. Document Released: 06/19/2009 Document Revised: 08/31/2015 Document Reviewed: 05/26/2012 Elsevier Interactive Patient Education  2017 Elsevier Inc.  

## 2017-06-18 ENCOUNTER — Encounter: Payer: Self-pay | Admitting: Obstetrics & Gynecology

## 2017-06-19 ENCOUNTER — Other Ambulatory Visit: Payer: Self-pay | Admitting: *Deleted

## 2017-06-19 MED ORDER — DOCUSATE SODIUM 100 MG PO CAPS: 100.0000 mg | ORAL_CAPSULE | Freq: Two times a day (BID) | ORAL | 0 refills | Status: DC

## 2017-06-19 MED ORDER — POLYETHYLENE GLYCOL 3350 17 G PO PACK: 17.0000 g | PACK | Freq: Every day | ORAL | 2 refills | Status: DC

## 2017-07-01 ENCOUNTER — Encounter (HOSPITAL_COMMUNITY): Payer: Self-pay

## 2017-07-01 ENCOUNTER — Inpatient Hospital Stay (HOSPITAL_COMMUNITY)
Admission: AD | Admit: 2017-07-01 | Discharge: 2017-07-01 | Disposition: A | Discharge status: Home / Self Care | Payer: Medicaid Other | Source: Ambulatory Visit | Attending: Family Medicine | Admitting: Family Medicine

## 2017-07-01 DIAGNOSIS — O9989 Other specified diseases and conditions complicating pregnancy, childbirth and the puerperium: Secondary | ICD-10-CM

## 2017-07-01 DIAGNOSIS — O26892 Other specified pregnancy related conditions, second trimester: Secondary | ICD-10-CM | POA: Insufficient documentation

## 2017-07-01 DIAGNOSIS — Z3A22 22 weeks gestation of pregnancy: Secondary | ICD-10-CM | POA: Diagnosis not present

## 2017-07-01 LAB — URINALYSIS, ROUTINE W REFLEX MICROSCOPIC
Bilirubin Urine: NEGATIVE
GLUCOSE, UA: NEGATIVE mg/dL
HGB URINE DIPSTICK: NEGATIVE
Ketones, ur: NEGATIVE mg/dL
LEUKOCYTES UA: NEGATIVE
Nitrite: NEGATIVE
PH: 7 (ref 5.0–8.0)
PROTEIN: NEGATIVE mg/dL
SPECIFIC GRAVITY, URINE: 1.015 (ref 1.005–1.030)
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
## 2017-07-03 ENCOUNTER — Encounter: Payer: Self-pay | Admitting: Obstetrics & Gynecology

## 2017-07-04 ENCOUNTER — Encounter: Payer: Self-pay | Admitting: Obstetrics and Gynecology

## 2017-07-10 ENCOUNTER — Ambulatory Visit (INDEPENDENT_AMBULATORY_CARE_PROVIDER_SITE_OTHER): Payer: Medicaid Other | Admitting: Obstetrics & Gynecology

## 2017-07-10 VITALS — BP 115/77 | HR 79 | Wt 119.2 lb

## 2017-07-10 DIAGNOSIS — Z3480 Encounter for supervision of other normal pregnancy, unspecified trimester: Secondary | ICD-10-CM

## 2017-07-10 NOTE — Progress Notes (Signed)
Elevated PHQ9 but refuses to see Behavior Clinician.

## 2017-07-10 NOTE — Patient Instructions (Signed)
Second Trimester of Pregnancy The second trimester is from week 13 through week 28, month 4 through 6. This is often the time in pregnancy that you feel your best. Often times, morning sickness has lessened or quit. You may have more energy, and you may get hungry more often. Your unborn baby (fetus) is growing rapidly. At the end of the sixth month, he or she is about 9 inches long and weighs about 1 pounds. You will likely feel the baby move (quickening) between 18 and 20 weeks of pregnancy. Follow these instructions at home:  Avoid all smoking, herbs, and alcohol. Avoid drugs not approved by your doctor.  Do not use any tobacco products, including cigarettes, chewing tobacco, and electronic cigarettes. If you need help quitting, ask your doctor. You may get counseling or other support to help you quit.  Only take medicine as told by your doctor. Some medicines are safe and some are not during pregnancy.  Exercise only as told by your doctor. Stop exercising if you start having cramps.  Eat regular, healthy meals.  Wear a good support bra if your breasts are tender.  Do not use hot tubs, steam rooms, or saunas.  Wear your seat belt when driving.  Avoid raw meat, uncooked cheese, and liter boxes and soil used by cats.  Take your prenatal vitamins.  Take 1500-2000 milligrams of calcium daily starting at the 20th week of pregnancy until you deliver your baby.  Try taking medicine that helps you poop (stool softener) as needed, and if your doctor approves. Eat more fiber by eating fresh fruit, vegetables, and whole grains. Drink enough fluids to keep your pee (urine) clear or pale yellow.  Take warm water baths (sitz baths) to soothe pain or discomfort caused by hemorrhoids. Use hemorrhoid cream if your doctor approves.  If you have puffy, bulging veins (varicose veins), wear support hose. Raise (elevate) your feet for 15 minutes, 3-4 times a day. Limit salt in your diet.  Avoid heavy  lifting, wear low heals, and sit up straight.  Rest with your legs raised if you have leg cramps or low back pain.  Visit your dentist if you have not gone during your pregnancy. Use a soft toothbrush to brush your teeth. Be gentle when you floss.  You can have sex (intercourse) unless your doctor tells you not to.  Go to your doctor visits. Get help if:  You feel dizzy.  You have mild cramps or pressure in your lower belly (abdomen).  You have a nagging pain in your belly area.  You continue to feel sick to your stomach (nauseous), throw up (vomit), or have watery poop (diarrhea).  You have bad smelling fluid coming from your vagina.  You have pain with peeing (urination). Get help right away if:  You have a fever.  You are leaking fluid from your vagina.  You have spotting or bleeding from your vagina.  You have severe belly cramping or pain.  You lose or gain weight rapidly.  You have trouble catching your breath and have chest pain.  You notice sudden or extreme puffiness (swelling) of your face, hands, ankles, feet, or legs.  You have not felt the baby move in over an hour.  You have severe headaches that do not go away with medicine.  You have vision changes. This information is not intended to replace advice given to you by your health care provider. Make sure you discuss any questions you have with your health care   provider. Document Released: 06/19/2009 Document Revised: 08/31/2015 Document Reviewed: 05/26/2012 Elsevier Interactive Patient Education  2017 Elsevier Inc.  

## 2017-07-10 NOTE — Progress Notes (Signed)
   PRENATAL VISIT NOTE  Subjective:  Erin Sullivan is a 27 y.o. G2P0010 at 3014w0d being seen today for ongoing prenatal care.  She is currently monitored for the following issues for this low-risk pregnancy and has Severe recurrent major depression without psychotic features (HCC); Alcohol abuse; Major depressive disorder, recurrent episode, severe (HCC); Alcohol abuse with intoxication (HCC); Suicidal ideation; Severe episode of recurrent major depressive disorder, without psychotic features (HCC); Supervision of other normal pregnancy, antepartum; History of depression; Bipolar disorder (HCC); and LGSIL on Pap smear of cervix on their problem list.  Patient reports no complaints.  Contractions: Not present. Vag. Bleeding: None.  Movement: Present. Denies leaking of fluid.   The following portions of the patient's history were reviewed and updated as appropriate: allergies, current medications, past family history, past medical history, past social history, past surgical history and problem list. Problem list updated.  Objective:   Vitals:   07/10/17 1545  BP: 115/77  Pulse: 79  Weight: 119 lb 3.2 oz (54.1 kg)    Fetal Status: Fetal Heart Rate (bpm): 152   Movement: Present     General:  Alert, oriented and cooperative. Patient is in no acute distress.  Skin: Skin is warm and dry. No rash noted.   Cardiovascular: Normal heart rate noted  Respiratory: Normal respiratory effort, no problems with respiration noted  Abdomen: Soft, gravid, appropriate for gestational age.  Pain/Pressure: Present     Pelvic: Cervical exam deferred        Extremities: Normal range of motion.  Edema: None  Mental Status: Normal mood and affect. Normal behavior. Normal judgment and thought content.   Assessment and Plan:  Pregnancy: G2P0010 at 4414w0d  1. Supervision of other normal pregnancy, antepartum US and dating reviewed  Preterm labor symptoms and general obstetric precautions including but not  limited to vaginal bleeding, contractions, leaking of fluid and fetal movement were reviewed in detail with the patient. Please refer to After Visit Summary for other counseling recommendations.  Return in about 1 month (around 08/07/2017) for 2 hr GTT.  No future appointments.  Scheryl DarterJames Morningstar Toft, MD

## 2017-07-31 ENCOUNTER — Encounter: Payer: Self-pay | Admitting: Obstetrics & Gynecology

## 2017-08-11 ENCOUNTER — Other Ambulatory Visit: Payer: Self-pay | Admitting: General Practice

## 2017-08-11 DIAGNOSIS — Z3480 Encounter for supervision of other normal pregnancy, unspecified trimester: Secondary | ICD-10-CM

## 2017-08-12 ENCOUNTER — Other Ambulatory Visit: Payer: Medicaid Other

## 2017-08-12 ENCOUNTER — Encounter: Payer: Self-pay | Admitting: Obstetrics and Gynecology

## 2017-08-12 ENCOUNTER — Ambulatory Visit (INDEPENDENT_AMBULATORY_CARE_PROVIDER_SITE_OTHER): Payer: Medicaid Other | Admitting: Clinical

## 2017-08-12 ENCOUNTER — Other Ambulatory Visit (HOSPITAL_COMMUNITY)
Admission: RE | Admit: 2017-08-12 | Discharge: 2017-08-12 | Disposition: A | Discharge status: Home / Self Care | Payer: Medicaid Other | Source: Ambulatory Visit | Attending: Obstetrics and Gynecology | Admitting: Obstetrics and Gynecology

## 2017-08-12 ENCOUNTER — Ambulatory Visit (INDEPENDENT_AMBULATORY_CARE_PROVIDER_SITE_OTHER): Payer: Medicaid Other | Admitting: Obstetrics and Gynecology

## 2017-08-12 VITALS — BP 106/64 | HR 66 | Wt 128.2 lb

## 2017-08-12 DIAGNOSIS — R87612 Low grade squamous intraepithelial lesion on cytologic smear of cervix (LGSIL): Secondary | ICD-10-CM | POA: Diagnosis not present

## 2017-08-12 DIAGNOSIS — F4323 Adjustment disorder with mixed anxiety and depressed mood: Secondary | ICD-10-CM | POA: Diagnosis not present

## 2017-08-12 DIAGNOSIS — Z23 Encounter for immunization: Secondary | ICD-10-CM

## 2017-08-12 DIAGNOSIS — A749 Chlamydial infection, unspecified: Secondary | ICD-10-CM | POA: Insufficient documentation

## 2017-08-12 DIAGNOSIS — Z3483 Encounter for supervision of other normal pregnancy, third trimester: Secondary | ICD-10-CM

## 2017-08-12 DIAGNOSIS — Z3480 Encounter for supervision of other normal pregnancy, unspecified trimester: Secondary | ICD-10-CM

## 2017-08-12 DIAGNOSIS — F319 Bipolar disorder, unspecified: Secondary | ICD-10-CM | POA: Diagnosis not present

## 2017-08-12 NOTE — BH Specialist Note (Signed)
Integrated Behavioral Health Follow Up Visit  MRN: [id number] Name: Erin Sullivan  Number of Integrated Behavioral Health Clinician visits: 2/6 Session Start time: 8:45  Session End time: 9:04 Total time: 20 minutes  Type of Service: Integrated Behavioral Health- Individual/Family Interpretor:No. Interpretor Name and Language: n/a  SUBJECTIVE: Erin Sullivan is a 27 y.o. female accompanied by n/a Patient was referred by Dr Earlene Plater for depression. Patient reports the following symptoms/concerns: Pt states her concern today is an increase in marital tension between herself and her husband during pregnancy, but does not wish to discuss further today. Pt feels she has good support from friends and family, and is open to receive educational material today to help prepare for postpartum.  Duration of problem: Current pregnancy; Severity of problem: moderate  OBJECTIVE: Mood: Depressed and Affect: Depressed and Tearful Risk of harm to self or others: No plan to harm self or others  LIFE CONTEXT: Family and Social: Pt lives with husband; supportive friends and family School/Work: Stopped working mid-January; plans to return postpartum Self-Care: - Life Changes: Current pregnancy; quit job in January  GOALS ADDRESSED: Patient will: 1.  Reduce symptoms of: agitation, anxiety, depression, insomnia and stress  2.  Increase knowledge and/or ability of: coping skills  3.  Demonstrate ability to: Increase healthy adjustment to current life circumstances and Increase adequate support systems for patient/family  INTERVENTIONS: Interventions utilized:  Functional Assessment of ADLs and Psychoeducation and/or Health Education Standardized Assessments completed: GAD-7 and PHQ 9  ASSESSMENT: Patient currently experiencing Adjustment disorder with mixed anxious and depressed mood.  Patient may benefit from continued brief therapeutic intervention regarding coping with symptoms of anxiety and  depression.  PLAN: 1. Follow up with behavioral health clinician on : Two weeks 2. Behavioral recommendations:  -Take home and read Postpartum Planner today 3. Referral(s): Integrated [employer] (In Clinic) 4. "From scale of 1-10, how likely are you to follow plan?": 9  Rae Lips, LCSW  Depression screen [facility] 2/9 07/10/2017 04/15/2017  Decreased Interest 2 0  Down, Depressed, Hopeless 1 0  PHQ - 2 Score 3 0  Altered sleeping 3 2  Tired, decreased energy 2 3  Change in appetite 1 1  Feeling bad or failure about yourself  1 0  Trouble concentrating 2 1  Moving slowly or fidgety/restless 0 0  Suicidal thoughts 0 0  PHQ-9 Score 12 7   GAD 7 : Generalized Anxiety Score 07/10/2017 07/10/2017 04/15/2017  Nervous, Anxious, on Edge 1 0 1  Control/stop worrying 2 - 1  Worry too much - different things 2 - 1  Trouble relaxing 2 - 1  Restless 1 - 0  Easily annoyed or irritable 1 - 0  Afraid - awful might happen 2 - 1  Total GAD 7 Score 11 - 5

## 2017-08-12 NOTE — Progress Notes (Signed)
   PRENATAL VISIT NOTE  Subjective:  Erin Sullivan is a 27 y.o. G2P0010 at [redacted]w[redacted]d being seen today for ongoing prenatal care.  She is currently monitored for the following issues for this high-risk pregnancy and has Severe recurrent major depression without psychotic features (HCC); Alcohol abuse; Major depressive disorder, recurrent episode, severe (HCC); Alcohol abuse with intoxication (HCC); Suicidal ideation; Severe episode of recurrent major depressive disorder, without psychotic features (HCC); Supervision of other normal pregnancy, antepartum; History of depression; Bipolar disorder (HCC); and LGSIL on Pap smear of cervix on their problem list.  Patient reports active movement, increased anxiety and depression, worried all the time about things that could go wrong, denies thoughts of harm to herself.  Contractions: Not present. Vag. Bleeding: None.  Movement: Present. Denies leaking of fluid.   The following portions of the patient's history were reviewed and updated as appropriate: allergies, current medications, past family history, past medical history, past social history, past surgical history and problem list. Problem list updated.  Objective:   Vitals:   08/12/17 0831  BP: 106/64  Pulse: 66  Weight: 128 lb 3.2 oz (58.2 kg)    Fetal Status: Fetal Heart Rate (bpm): 125 Fundal Height: 27 cm Movement: Present     General:  Alert, oriented and cooperative. Patient is in no acute distress.  Skin: Skin is warm and dry. No rash noted.   Cardiovascular: Normal heart rate noted  Respiratory: Normal respiratory effort, no problems with respiration noted  Abdomen: Soft, gravid, appropriate for gestational age.  Pain/Pressure: Present     Pelvic: Cervical exam deferred        Extremities: Normal range of motion.  Edema: None  Mental Status: Normal mood and affect. Normal behavior. Normal judgment and thought content.   Assessment and Plan:  Pregnancy: G2P0010 at [redacted]w[redacted]d  1.  Supervision of other normal pregnancy, antepartum - Tdap vaccine greater than or equal to 7yo IM - CBC, RPR, 2 hr GTT today  2. Bipolar affective disorder, remission status unspecified (HCC) Has been seen by [facility] Increased thoughts of anxiety Agreeable to see BH today  3. LGSIL on Pap smear of cervix colpo PP  4. Chlamydia TOC done today   Preterm labor symptoms and general obstetric precautions including but not limited to vaginal bleeding, contractions, leaking of fluid and fetal movement were reviewed in detail with the patient. Please refer to After Visit Summary for other counseling recommendations.  Return in about 2 weeks (around 08/26/2017) for OB visit.  Future Appointments  Date Time Provider Department Center  08/12/2017  9:30 AM [facility]    Conan Bowens, MD

## 2017-08-13 LAB — CBC
Hematocrit: 34.7 % (ref 34.0–46.6)
Hemoglobin: 11.4 g/dL (ref 11.1–15.9)
MCH: 30.1 pg (ref 26.6–33.0)
MCHC: 32.9 g/dL (ref 31.5–35.7)
MCV: 92 fL (ref 79–97)
Platelets: 200 10*3/uL (ref 150–379)
RBC: 3.79 x10E6/uL (ref 3.77–5.28)
RDW: 14.2 % (ref 12.3–15.4)
WBC: 11.7 10*3/uL — ABNORMAL HIGH (ref 3.4–10.8)

## 2017-08-13 LAB — GLUCOSE TOLERANCE, 2 HOURS W/ 1HR
GLUCOSE, 2 HOUR: 87 mg/dL (ref 65–152)
Glucose, 1 hour: 129 mg/dL (ref 65–179)
Glucose, Fasting: 75 mg/dL (ref 65–91)

## 2017-08-13 LAB — HIV ANTIBODY (ROUTINE TESTING W REFLEX): HIV SCREEN 4TH GENERATION: NONREACTIVE

## 2017-08-13 LAB — RPR: RPR: NONREACTIVE

## 2017-08-13 LAB — GC/CHLAMYDIA PROBE AMP (~~LOC~~) NOT AT ARMC
CHLAMYDIA, DNA PROBE: NEGATIVE
NEISSERIA GONORRHEA: NEGATIVE

## 2017-08-26 NOTE — BH Specialist Note (Signed)
Error, pt declines visit today

## 2017-08-29 ENCOUNTER — Ambulatory Visit: Payer: Medicaid Other | Admitting: Clinical

## 2017-08-29 ENCOUNTER — Ambulatory Visit (INDEPENDENT_AMBULATORY_CARE_PROVIDER_SITE_OTHER): Payer: Medicaid Other | Admitting: Obstetrics & Gynecology

## 2017-08-29 VITALS — BP 121/63 | HR 72 | Wt 130.6 lb

## 2017-08-29 DIAGNOSIS — Z8659 Personal history of other mental and behavioral disorders: Secondary | ICD-10-CM

## 2017-08-29 DIAGNOSIS — Z3480 Encounter for supervision of other normal pregnancy, unspecified trimester: Secondary | ICD-10-CM

## 2017-08-29 NOTE — Patient Instructions (Signed)

## 2017-08-29 NOTE — Progress Notes (Signed)
Pt declines [facility] Clin[facility] today 

## 2017-08-29 NOTE — Progress Notes (Signed)
   PRENATAL VISIT NOTE  Subjective:  Erin Sullivan is a 27 y.o. G2P0010 at [redacted]w[redacted]d being seen today for ongoing prenatal care.  She is currently monitored for the following issues for this low-risk pregnancy and has Severe recurrent major depression without psychotic features (HCC); Alcohol abuse; Major depressive disorder, recurrent episode, severe (HCC); Alcohol abuse with intoxication (HCC); Suicidal ideation; Severe episode of recurrent major depressive disorder, without psychotic features (HCC); Supervision of other normal pregnancy, antepartum; History of depression; Bipolar disorder (HCC); and LGSIL on Pap smear of cervix on their problem list.  Patient reports trouble falling asleep.  Contractions: Not present. Vag. Bleeding: None.  Movement: Present. Denies leaking of fluid.   The following portions of the patient's history were reviewed and updated as appropriate: allergies, current medications, past family history, past medical history, past social history, past surgical history and problem list. Problem list updated.  Objective:   Vitals:   08/29/17 0929  BP: 121/63  Pulse: 72  Weight: 130 lb 9.6 oz (59.2 kg)    Fetal Status: Fetal Heart Rate (bpm): 127 Fundal Height: 31 cm Movement: Present     General:  Alert, oriented and cooperative. Patient is in no acute distress.  Skin: Skin is warm and dry. No rash noted.   Cardiovascular: Normal heart rate noted  Respiratory: Normal respiratory effort, no problems with respiration noted  Abdomen: Soft, gravid, appropriate for gestational age.  Pain/Pressure: Present     Pelvic: Cervical exam deferred        Extremities: Normal range of motion.  Edema: None  Mental Status: Normal mood and affect. Normal behavior. Normal judgment and thought content.   Assessment and Plan:  Pregnancy: G2P0010 at [redacted]w[redacted]d  1. Supervision of other normal pregnancy, antepartum   2. History of depression May try melatonin at bedtime  Preterm labor  symptoms and general obstetric precautions including but not limited to vaginal bleeding, contractions, leaking of fluid and fetal movement were reviewed in detail with the patient. Please refer to After Visit Summary for other counseling recommendations.  Return in about 2 weeks (around 09/12/2017).  Future Appointments  Date Time Provider Department Center  09/12/2017  8:15 AM Hermina Staggers, MD [facility]  09/24/2017  1:15 PM Adam Phenix, MD [facility]    Scheryl Darter, MD

## 2017-08-30 ENCOUNTER — Encounter: Payer: Self-pay | Admitting: Obstetrics & Gynecology

## 2017-09-12 ENCOUNTER — Ambulatory Visit (INDEPENDENT_AMBULATORY_CARE_PROVIDER_SITE_OTHER): Payer: Medicaid Other | Admitting: Obstetrics and Gynecology

## 2017-09-12 ENCOUNTER — Encounter: Payer: Self-pay | Admitting: Obstetrics and Gynecology

## 2017-09-12 VITALS — BP 119/70 | HR 70 | Wt 131.9 lb

## 2017-09-12 DIAGNOSIS — Z3480 Encounter for supervision of other normal pregnancy, unspecified trimester: Secondary | ICD-10-CM

## 2017-09-12 DIAGNOSIS — F319 Bipolar disorder, unspecified: Secondary | ICD-10-CM

## 2017-09-12 DIAGNOSIS — R87612 Low grade squamous intraepithelial lesion on cytologic smear of cervix (LGSIL): Secondary | ICD-10-CM

## 2017-09-12 NOTE — Progress Notes (Signed)
Subjective:  Erin Sullivan is a 27 y.o. G2P0010 at 6856w1d being seen today for ongoing prenatal care.  She is currently monitored for the following issues for this high-risk pregnancy and has Severe recurrent major depression without psychotic features (HCC); Alcohol abuse; Major depressive disorder, recurrent episode, severe (HCC); Alcohol abuse with intoxication (HCC); Suicidal ideation; Severe episode of recurrent major depressive disorder, without psychotic features (HCC); Supervision of other normal pregnancy, antepartum; History of depression; Bipolar disorder (HCC); and LGSIL on Pap smear of cervix on their problem list.  Patient reports no complaints.  Contractions: Not present. Vag. Bleeding: None.  Movement: Present. Denies leaking of fluid.   The following portions of the patient's history were reviewed and updated as appropriate: allergies, current medications, past family history, past medical history, past social history, past surgical history and problem list. Problem list updated.  Objective:   Vitals:   09/12/17 0830  BP: 119/70  Pulse: 70  Weight: 131 lb 14.4 oz (59.8 kg)    Fetal Status: Fetal Heart Rate (bpm): 131   Movement: Present     General:  Alert, oriented and cooperative. Patient is in no acute distress.  Skin: Skin is warm and dry. No rash noted.   Cardiovascular: Normal heart rate noted  Respiratory: Normal respiratory effort, no problems with respiration noted  Abdomen: Soft, gravid, appropriate for gestational age. Pain/Pressure: Present     Pelvic:  Cervical exam deferred        Extremities: Normal range of motion.  Edema: None  Mental Status: Normal mood and affect. Normal behavior. Normal judgment and thought content.   Urinalysis:      Assessment and Plan:  Pregnancy: G2P0010 at 8856w1d  1. Supervision of other normal pregnancy, antepartum Stable  2. Bipolar affective disorder, remission status unspecified (HCC) Stable  3. LGSIL on Pap smear of  cervix PP colpo  Preterm labor symptoms and general obstetric precautions including but not limited to vaginal bleeding, contractions, leaking of fluid and fetal movement were reviewed in detail with the patient. Please refer to After Visit Summary for other counseling recommendations.  Return in about 2 weeks (around 09/26/2017) for OB visit.   Hermina StaggersErvin, Erin Hergert L, MD

## 2017-09-12 NOTE — Patient Instructions (Signed)
Third Trimester of Pregnancy The third trimester is from week 28 through week 40 (months 7 through 9). The third trimester is a time when the unborn baby (fetus) is growing rapidly. At the end of the ninth month, the fetus is about 20 inches in length and weighs 6-10 pounds. Body changes during your third trimester Your body will continue to go through many changes during pregnancy. The changes vary from woman to woman. During the third trimester:  Your weight will continue to increase. You can expect to gain 25-35 pounds (11-16 kg) by the end of the pregnancy.  You may begin to get stretch marks on your hips, abdomen, and breasts.  You may urinate more often because the fetus is moving lower into your pelvis and pressing on your bladder.  You may develop or continue to have heartburn. This is caused by increased hormones that slow down muscles in the digestive tract.  You may develop or continue to have constipation because increased hormones slow digestion and cause the muscles that push waste through your intestines to relax.  You may develop hemorrhoids. These are swollen veins (varicose veins) in the rectum that can itch or be painful.  You may develop swollen, bulging veins (varicose veins) in your legs.  You may have increased body aches in the pelvis, back, or thighs. This is due to weight gain and increased hormones that are relaxing your joints.  You may have changes in your hair. These can include thickening of your hair, rapid growth, and changes in texture. Some women also have hair loss during or after pregnancy, or hair that feels dry or thin. Your hair will most likely return to normal after your baby is born.  Your breasts will continue to grow and they will continue to become tender. A yellow fluid (colostrum) may leak from your breasts. This is the first milk you are producing for your baby.  Your belly button may stick out.  You may notice more swelling in your hands,  face, or ankles.  You may have increased tingling or numbness in your hands, arms, and legs. The skin on your belly may also feel numb.  You may feel short of breath because of your expanding uterus.  You may have more problems sleeping. This can be caused by the size of your belly, increased need to urinate, and an increase in your body's metabolism.  You may notice the fetus "dropping," or moving lower in your abdomen (lightening).  You may have increased vaginal discharge.  You may notice your joints feel loose and you may have pain around your pelvic bone.  What to expect at prenatal visits You will have prenatal exams every 2 weeks until week 36. Then you will have weekly prenatal exams. During a routine prenatal visit:  You will be weighed to make sure you and the baby are growing normally.  Your blood pressure will be taken.  Your abdomen will be measured to track your baby's growth.  The fetal heartbeat will be listened to.  Any test results from the previous visit will be discussed.  You may have a cervical check near your due date to see if your cervix has softened or thinned (effaced).  You will be tested for Group B streptococcus. This happens between 35 and 37 weeks.  Your health care provider may ask you:  What your birth plan is.  How you are feeling.  If you are feeling the baby move.  If you have had   any abnormal symptoms, such as leaking fluid, bleeding, severe headaches, or abdominal cramping.  If you are using any tobacco products, including cigarettes, chewing tobacco, and electronic cigarettes.  If you have any questions.  Other tests or screenings that may be performed during your third trimester include:  Blood tests that check for low iron levels (anemia).  Fetal testing to check the health, activity level, and growth of the fetus. Testing is done if you have certain medical conditions or if there are problems during the  pregnancy.  Nonstress test (NST). This test checks the health of your baby to make sure there are no signs of problems, such as the baby not getting enough oxygen. During this test, a belt is placed around your belly. The baby is made to move, and its heart rate is monitored during movement.  What is false labor? False labor is a condition in which you feel small, irregular tightenings of the muscles in the womb (contractions) that usually go away with rest, changing position, or drinking water. These are called Braxton Hicks contractions. Contractions may last for hours, days, or even weeks before true labor sets in. If contractions come at regular intervals, become more frequent, increase in intensity, or become painful, you should see your health care provider. What are the signs of labor?  Abdominal cramps.  Regular contractions that start at 10 minutes apart and become stronger and more frequent with time.  Contractions that start on the top of the uterus and spread down to the lower abdomen and back.  Increased pelvic pressure and dull back pain.  A watery or bloody mucus discharge that comes from the vagina.  Leaking of amniotic fluid. This is also known as your "water breaking." It could be a slow trickle or a gush. Let your health care provider know if it has a color or strange odor. If you have any of these signs, call your health care provider right away, even if it is before your due date. Follow these instructions at home: Medicines  Follow your health care provider's instructions regarding medicine use. Specific medicines may be either safe or unsafe to take during pregnancy.  Take a prenatal vitamin that contains at least 600 micrograms (mcg) of folic acid.  If you develop constipation, try taking a stool softener if your health care provider approves. Eating and drinking  Eat a balanced diet that includes fresh fruits and vegetables, whole grains, good sources of protein  such as meat, eggs, or tofu, and low-fat dairy. Your health care provider will help you determine the amount of weight gain that is right for you.  Avoid raw meat and uncooked cheese. These carry germs that can cause birth defects in the baby.  If you have low calcium intake from food, talk to your health care provider about whether you should take a daily calcium supplement.  Eat four or five small meals rather than three large meals a day.  Limit foods that are high in fat and processed sugars, such as fried and sweet foods.  To prevent constipation: ? Drink enough fluid to keep your urine clear or pale yellow. ? Eat foods that are high in fiber, such as fresh fruits and vegetables, whole grains, and beans. Activity  Exercise only as directed by your health care provider. Most women can continue their usual exercise routine during pregnancy. Try to exercise for 30 minutes at least 5 days a week. Stop exercising if you experience uterine contractions.  Avoid heavy   lifting.  Do not exercise in extreme heat or humidity, or at high altitudes.  Wear low-heel, comfortable shoes.  Practice good posture.  You may continue to have sex unless your health care provider tells you otherwise. Relieving pain and discomfort  Take frequent breaks and rest with your legs elevated if you have leg cramps or low back pain.  Take warm sitz baths to soothe any pain or discomfort caused by hemorrhoids. Use hemorrhoid cream if your health care provider approves.  Wear a good support bra to prevent discomfort from breast tenderness.  If you develop varicose veins: ? Wear support pantyhose or compression stockings as told by your healthcare provider. ? Elevate your feet for 15 minutes, 3-4 times a day. Prenatal care  Write down your questions. Take them to your prenatal visits.  Keep all your prenatal visits as told by your health care provider. This is important. Safety  Wear your seat belt at  all times when driving.  Make a list of emergency phone numbers, including numbers for family, friends, the hospital, and police and fire departments. General instructions  Avoid cat litter boxes and soil used by cats. These carry germs that can cause birth defects in the baby. If you have a cat, ask someone to clean the litter box for you.  Do not travel far distances unless it is absolutely necessary and only with the approval of your health care provider.  Do not use hot tubs, steam rooms, or saunas.  Do not drink alcohol.  Do not use any products that contain nicotine or tobacco, such as cigarettes and e-cigarettes. If you need help quitting, ask your health care provider.  Do not use any medicinal herbs or unprescribed drugs. These chemicals affect the formation and growth of the baby.  Do not douche or use tampons or scented sanitary pads.  Do not cross your legs for long periods of time.  To prepare for the arrival of your baby: ? Take prenatal classes to understand, practice, and ask questions about labor and delivery. ? Make a trial run to the hospital. ? Visit the hospital and tour the maternity area. ? Arrange for maternity or paternity leave through employers. ? Arrange for family and friends to take care of pets while you are in the hospital. ? Purchase a rear-facing car seat and make sure you know how to install it in your car. ? Pack your hospital bag. ? Prepare the baby's nursery. Make sure to remove all pillows and stuffed animals from the baby's crib to prevent suffocation.  Visit your dentist if you have not gone during your pregnancy. Use a soft toothbrush to brush your teeth and be gentle when you floss. Contact a health care provider if:  You are unsure if you are in labor or if your water has broken.  You become dizzy.  You have mild pelvic cramps, pelvic pressure, or nagging pain in your abdominal area.  You have lower back pain.  You have persistent  nausea, vomiting, or diarrhea.  You have an unusual or bad smelling vaginal discharge.  You have pain when you urinate. Get help right away if:  Your water breaks before 37 weeks.  You have regular contractions less than 5 minutes apart before 37 weeks.  You have a fever.  You are leaking fluid from your vagina.  You have spotting or bleeding from your vagina.  You have severe abdominal pain or cramping.  You have rapid weight loss or weight gain.    You have shortness of breath with chest pain.  You notice sudden or extreme swelling of your face, hands, ankles, feet, or legs.  Your baby makes fewer than 10 movements in 2 hours.  You have severe headaches that do not go away when you take medicine.  You have vision changes. Summary  The third trimester is from week 28 through week 40, months 7 through 9. The third trimester is a time when the unborn baby (fetus) is growing rapidly.  During the third trimester, your discomfort may increase as you and your baby continue to gain weight. You may have abdominal, leg, and back pain, sleeping problems, and an increased need to urinate.  During the third trimester your breasts will keep growing and they will continue to become tender. A yellow fluid (colostrum) may leak from your breasts. This is the first milk you are producing for your baby.  False labor is a condition in which you feel small, irregular tightenings of the muscles in the womb (contractions) that eventually go away. These are called Braxton Hicks contractions. Contractions may last for hours, days, or even weeks before true labor sets in.  Signs of labor can include: abdominal cramps; regular contractions that start at 10 minutes apart and become stronger and more frequent with time; watery or bloody mucus discharge that comes from the vagina; increased pelvic pressure and dull back pain; and leaking of amniotic fluid. This information is not intended to replace advice  given to you by your health care provider. Make sure you discuss any questions you have with your health care provider. Document Released: 03/19/2001 Document Revised: 08/31/2015 Document Reviewed: 05/26/2012 Elsevier Interactive Patient Education  2017 [employer]  

## 2017-09-22 ENCOUNTER — Encounter: Payer: Self-pay | Admitting: Obstetrics & Gynecology

## 2017-09-22 ENCOUNTER — Other Ambulatory Visit: Payer: Self-pay | Admitting: *Deleted

## 2017-09-22 MED ORDER — DOCUSATE SODIUM 100 MG PO CAPS: 100.0000 mg | ORAL_CAPSULE | Freq: Two times a day (BID) | ORAL | 1 refills | Status: DC

## 2017-09-24 ENCOUNTER — Ambulatory Visit (INDEPENDENT_AMBULATORY_CARE_PROVIDER_SITE_OTHER): Payer: Medicaid Other | Admitting: Obstetrics & Gynecology

## 2017-09-24 ENCOUNTER — Encounter: Payer: Self-pay | Admitting: Obstetrics & Gynecology

## 2017-09-24 VITALS — BP 124/65 | HR 71 | Wt 132.8 lb

## 2017-09-24 DIAGNOSIS — F1010 Alcohol abuse, uncomplicated: Secondary | ICD-10-CM

## 2017-09-24 DIAGNOSIS — Z3480 Encounter for supervision of other normal pregnancy, unspecified trimester: Secondary | ICD-10-CM

## 2017-09-24 DIAGNOSIS — Z3483 Encounter for supervision of other normal pregnancy, third trimester: Secondary | ICD-10-CM

## 2017-09-24 DIAGNOSIS — F332 Major depressive disorder, recurrent severe without psychotic features: Secondary | ICD-10-CM

## 2017-09-24 NOTE — Progress Notes (Signed)
Patient and/or legal guardian verbally declined to meet with Behavioral Health Clinician about elevated gad 7. States she has seen [place] before and doesn't need to see her today. Is trying to get into someone she has seen before.

## 2017-09-24 NOTE — Patient Instructions (Signed)
May use tucks pads and or preparation H ointment or generic equivalent as needed for hemorrhoids.

## 2017-09-24 NOTE — Progress Notes (Signed)
States stopped melatonin because  Made her have weird dreams. States still not sleeping good and one day thought she might be going to have a panic attack. C/o hemorrhoids.

## 2017-09-24 NOTE — Progress Notes (Signed)
   PRENATAL VISIT NOTE  Subjective:  Erin Sullivan is a 27 y.o. G2P0010 at 1830w6d being seen today for ongoing prenatal care.  She is currently monitored for the following issues for this high-risk pregnancy and has Severe recurrent major depression without psychotic features (HCC); Alcohol abuse; Major depressive disorder, recurrent episode, severe (HCC); Alcohol abuse with intoxication (HCC); Suicidal ideation; Severe episode of recurrent major depressive disorder, without psychotic features (HCC); Supervision of other normal pregnancy, antepartum; History of depression; Bipolar disorder (HCC); and LGSIL on Pap smear of cervix on their problem list.  Patient reports melatonin did not help her sleep. Benadryl has helped in the past.  Contractions: Not present. Vag. Bleeding: None.  Movement: Present. Denies leaking of fluid.   The following portions of the patient's history were reviewed and updated as appropriate: allergies, current medications, past family history, past medical history, past social history, past surgical history and problem list. Problem list updated.  Objective:   Vitals:   09/24/17 1309  BP: 124/65  Pulse: 71  Weight: 132 lb 12.8 oz (60.2 kg)    Fetal Status: Fetal Heart Rate (bpm): 128 Fundal Height: 36 cm Movement: Present     General:  Alert, oriented and cooperative. Patient is in no acute distress.  Skin: Skin is warm and dry. No rash noted.   Cardiovascular: Normal heart rate noted  Respiratory: Normal respiratory effort, no problems with respiration noted  Abdomen: Soft, gravid, appropriate for gestational age.  Pain/Pressure: Present     Pelvic: Cervical exam deferred        Extremities: Normal range of motion.  Edema: Trace  Mental Status: Normal mood and affect. Normal behavior. Normal judgment and thought content.   Assessment and Plan:  Pregnancy: G2P0010 at 2130w6d  1. Severe episode of recurrent major depressive disorder, without psychotic features  (HCC) Insomnia, will try Benadryl  2. Supervision of other normal pregnancy, antepartum   3. Alcohol abuse   Preterm labor symptoms and general obstetric precautions including but not limited to vaginal bleeding, contractions, leaking of fluid and fetal movement were reviewed in detail with the patient. Please refer to After Visit Summary for other counseling recommendations.  Return in about 1 week (around 10/01/2017).  No future appointments.  Scheryl DarterJames Ailie Gage, MD

## 2017-10-06 ENCOUNTER — Ambulatory Visit (INDEPENDENT_AMBULATORY_CARE_PROVIDER_SITE_OTHER): Payer: Medicaid Other | Admitting: Obstetrics & Gynecology

## 2017-10-06 ENCOUNTER — Other Ambulatory Visit (HOSPITAL_COMMUNITY)
Admission: RE | Admit: 2017-10-06 | Discharge: 2017-10-06 | Disposition: A | Discharge status: Home / Self Care | Payer: Medicaid Other | Source: Ambulatory Visit | Attending: Obstetrics & Gynecology | Admitting: Obstetrics & Gynecology

## 2017-10-06 VITALS — BP 115/65 | HR 80 | Wt 138.8 lb

## 2017-10-06 DIAGNOSIS — Z3480 Encounter for supervision of other normal pregnancy, unspecified trimester: Secondary | ICD-10-CM

## 2017-10-06 DIAGNOSIS — F332 Major depressive disorder, recurrent severe without psychotic features: Secondary | ICD-10-CM

## 2017-10-06 DIAGNOSIS — F10129 Alcohol abuse with intoxication, unspecified: Secondary | ICD-10-CM

## 2017-10-06 NOTE — BH Specialist Note (Deleted)
Integrated Behavioral Health Follow Up Visit  MRN: [id number] Name: Erin Sullivan  Number of Integrated Behavioral Health Clinician visits: 3/6 Session Start time: ***  Session End time: *** Total time: {IBH Total Time:21014050}  Type of Service: Integrated Behavioral Health- Individual/Family Interpretor:{yes [id number]}no:[id number]} Interpretor Name and Language: ***  SUBJECTIVE: Erin Sullivan is a 27 y.o. female accompanied by {Patient accompanied [phone]} Patient was referred by *** for ***. Patient reports the following symptoms/concerns: *** Duration of problem: ***; Severity of problem: {Mild/Moderate/Severe:20260}  OBJECTIVE: Mood: {BHH MOOD:22306} and Affect: {BHH AFFECT:22307} Risk of harm to self or others: {CHL AMB BH Suicide Current Mental Status:21022748}  LIFE CONTEXT: Family and Social: *** School/Work: *** Self-Care: *** Life Changes: ***  GOALS ADDRESSED: Patient will: 1.  Reduce symptoms of: {IBH Symptoms:21014056}  2.  Increase knowledge and/or ability of: {IBH Patient Tools:21014057}  3.  Demonstrate ability to: {IBH Goals:21014053}  INTERVENTIONS: Interventions utilized:  {IBH Interventions:21014054} Standardized Assessments completed: {IBH Screening Tools:21014051}  ASSESSMENT: Patient currently experiencing ***.   Patient may benefit from ***.  PLAN: 1. Follow up with behavioral health clinician on : *** 2. Behavioral recommendations: *** 3. Referral(s): {IBH Referrals:21014055} 4. "From scale of 1-10, how likely are you to follow plan?": ***  Rae LipsJamie C Lashelle Koy, LCSW  [facility] 10/06/2017 09/24/2017 09/12/2017 08/29/2017 08/12/2017  Decreased Interest 2 1 1 1 1   Down, Depressed, Hopeless 2 1 1 1 1   PHQ - 2 Score 4 2 2 2 2   Altered sleeping 3 3 3 2 2   Tired, decreased energy 2 1 2 1 2   Change in appetite 1 1 1 1 1   Feeling bad or failure about yourself  2 1 2 1 1   Trouble concentrating 2 1 2 1 2   Moving slowly or fidgety/restless 0  0 0 0 0  Suicidal thoughts 0 0 0 0 0  PHQ-9 Score 14 9 12 8 10   Difficult doing work/chores Somewhat difficult - - - -   GAD 7 : Generalized Anxiety Score 10/06/2017 09/24/2017 09/12/2017 08/29/2017  Nervous, Anxious, on Edge 3 3 2 1   Control/stop worrying 3 2 2 1   Worry too much - different things 3 2 2 1   Trouble relaxing 2 1 2 1   Restless 1 1 1 1   Easily annoyed or irritable 1 0 1 1  Afraid - awful might happen 3 2 2 1   Total GAD 7 Score 16 11 12 7   Anxiety Difficulty Somewhat difficult - - -

## 2017-10-06 NOTE — Progress Notes (Signed)
   PRENATAL VISIT NOTE  Subjective:  Erin Sullivan is a 27 y.o. G2P0010 at 1532w4d being seen today for ongoing prenatal care.  She is currently monitored for the following issues for this high-risk pregnancy and has Severe recurrent major depression without psychotic features (HCC); Alcohol abuse; Major depressive disorder, recurrent episode, severe (HCC); Alcohol abuse with intoxication (HCC); Suicidal ideation; Severe episode of recurrent major depressive disorder, without psychotic features (HCC); Supervision of other normal pregnancy, antepartum; History of depression; Bipolar disorder (HCC); and LGSIL on Pap smear of cervix on their problem list.  Patient reports no complaints.  Contractions: Not present. Vag. Bleeding: None.  Movement: Present. Denies leaking of fluid.   The following portions of the patient's history were reviewed and updated as appropriate: allergies, current medications, past family history, past medical history, past social history, past surgical history and problem list. Problem list updated.  Objective:   Vitals:   10/06/17 1357  BP: 115/65  Pulse: 80  Weight: 138 lb 12.8 oz (63 kg)    Fetal Status: Fetal Heart Rate (bpm): 142   Movement: Present     General:  Alert, oriented and cooperative. Patient is in no acute distress.  Skin: Skin is warm and dry. No rash noted.   Cardiovascular: Normal heart rate noted  Respiratory: Normal respiratory effort, no problems with respiration noted  Abdomen: Soft, gravid, appropriate for gestational age.  Pain/Pressure: Present     Pelvic: Cervical exam performed        Extremities: Normal range of motion.  Edema: Trace  Mental Status: Normal mood and affect. Normal behavior. Normal judgment and thought content.   Assessment and Plan:  Pregnancy: G2P0010 at 2232w4d  1. Supervision of other normal pregnancy, antepartum  - Culture, beta strep (group b only) - Cervicovaginal ancillary only  2. Severe recurrent major  depression without psychotic features (HCC) Pt scored high on her anxiety PHQ9 She declined a consult with behavioral health today  3. Alcohol abuse with intoxication (HCC) Pt denies active use in pregnancy  4. Severe episode of recurrent major depressive disorder, without psychotic features (HCC)  Preterm labor symptoms and general obstetric precautions including but not limited to vaginal bleeding, contractions, leaking of fluid and fetal movement were reviewed in detail with the patient. Please refer to After Visit Summary for other counseling recommendations.  Return in about 1 week (around 10/13/2017) for ob provider.  Future Appointments  Date Time Provider Department Center  10/06/2017  2:50 PM [facility]  10/13/2017  8:15 AM Anyanwu, Jethro BastosUgonna A, MD [facility]  10/20/2017  1:15 PM Levie HeritageStinson, Jacob J, DO [facility]  10/27/2017  1:15 PM Adam PhenixArnold, James G, MD [facility]  11/03/2017  8:15 AM Anyanwu, Jethro BastosUgonna A, MD [facility]    Willodean Rosenthalarolyn Harraway-Smith, MD

## 2017-10-06 NOTE — Patient Instructions (Signed)

## 2017-10-07 LAB — CERVICOVAGINAL ANCILLARY ONLY
CHLAMYDIA, DNA PROBE: NEGATIVE
Neisseria Gonorrhea: NEGATIVE

## 2017-10-10 LAB — CULTURE, BETA STREP (GROUP B ONLY): Strep Gp B Culture: NEGATIVE

## 2017-10-13 ENCOUNTER — Ambulatory Visit (INDEPENDENT_AMBULATORY_CARE_PROVIDER_SITE_OTHER): Payer: Medicaid Other | Admitting: Obstetrics & Gynecology

## 2017-10-13 VITALS — BP 119/76 | HR 72 | Wt 139.3 lb

## 2017-10-13 DIAGNOSIS — Z3480 Encounter for supervision of other normal pregnancy, unspecified trimester: Secondary | ICD-10-CM

## 2017-10-13 DIAGNOSIS — Z3483 Encounter for supervision of other normal pregnancy, third trimester: Secondary | ICD-10-CM

## 2017-10-13 NOTE — Progress Notes (Signed)
   PRENATAL VISIT NOTE  Subjective:  Erin Sullivan is a 27 y.o. G2P0010 at 2658w4d being seen today for ongoing prenatal care.  She is currently monitored for the following issues for this low-risk pregnancy and has Severe recurrent major depression without psychotic features (HCC); Alcohol abuse; Major depressive disorder, recurrent episode, severe (HCC); Alcohol abuse with intoxication (HCC); Suicidal ideation; Severe episode of recurrent major depressive disorder, without psychotic features (HCC); Supervision of other normal pregnancy, antepartum; History of depression; Bipolar disorder (HCC); and LGSIL on Pap smear of cervix on their problem list.  Patient reports no complaints.  Contractions: Not present. Vag. Bleeding: None.  Movement: Present. Denies leaking of fluid.   The following portions of the patient's history were reviewed and updated as appropriate: allergies, current medications, past family history, past medical history, past social history, past surgical history and problem list. Problem list updated.  Objective:   Vitals:   10/13/17 0814  BP: 119/76  Pulse: 72  Weight: 139 lb 4.8 oz (63.2 kg)    Fetal Status: Fetal Heart Rate (bpm): 119 Fundal Height: 37 cm Movement: Present     General:  Alert, oriented and cooperative. Patient is in no acute distress.  Skin: Skin is warm and dry. No rash noted.   Cardiovascular: Normal heart rate noted  Respiratory: Normal respiratory effort, no problems with respiration noted  Abdomen: Soft, gravid, appropriate for gestational age.  Pain/Pressure: Present     Pelvic: Cervical exam deferred        Extremities: Normal range of motion.  Edema: Trace  Mental Status: Normal mood and affect. Normal behavior. Normal judgment and thought content.   Assessment and Plan:  Pregnancy: G2P0010 at 4758w4d  1. Supervision of other normal pregnancy, antepartum Term labor symptoms and general obstetric precautions including but not limited to  vaginal bleeding, contractions, leaking of fluid and fetal movement were reviewed in detail with the patient. Please refer to After Visit Summary for other counseling recommendations.  Return in about 1 week (around 10/20/2017) for OB Visit (LOB).  Future Appointments  Date Time Provider Department Center  10/20/2017  1:15 PM Noralee CharsStinson, Jacob J, DO [facility]  10/27/2017  1:15 PM Adam PhenixArnold, James G, MD [facility]  11/03/2017  8:15 AM Kadie Balestrieri, Jethro BastosUgonna A, [facility]    Jaynie CollinsUgonna Haris Baack, MD

## 2017-10-13 NOTE — Patient Instructions (Signed)
Return to clinic for any scheduled appointments or obstetric concerns, or go to [facility] for evaluation  

## 2017-10-20 ENCOUNTER — Ambulatory Visit (INDEPENDENT_AMBULATORY_CARE_PROVIDER_SITE_OTHER): Payer: Medicaid Other | Admitting: Family Medicine

## 2017-10-20 VITALS — BP 112/69 | HR 67 | Wt 139.4 lb

## 2017-10-20 DIAGNOSIS — Z3480 Encounter for supervision of other normal pregnancy, unspecified trimester: Secondary | ICD-10-CM

## 2017-10-20 DIAGNOSIS — R87612 Low grade squamous intraepithelial lesion on cytologic smear of cervix (LGSIL): Secondary | ICD-10-CM

## 2017-10-20 DIAGNOSIS — Z3483 Encounter for supervision of other normal pregnancy, third trimester: Secondary | ICD-10-CM

## 2017-10-20 NOTE — Progress Notes (Signed)
   PRENATAL VISIT NOTE  Subjective:  Erin Sullivan is a 27 y.o. G2P0010 at 1976w4d being seen today for ongoing prenatal care.  She is currently monitored for the following issues for this low-risk pregnancy and has Severe recurrent major depression without psychotic features (HCC); Alcohol abuse; Major depressive disorder, recurrent episode, severe (HCC); Alcohol abuse with intoxication (HCC); Suicidal ideation; Severe episode of recurrent major depressive disorder, without psychotic features (HCC); Supervision of other normal pregnancy, antepartum; History of depression; Bipolar disorder (HCC); and LGSIL on Pap smear of cervix on their problem list.  Patient reports vaginal pressure intermittently.  Contractions: Not present. Vag. Bleeding: None.  Movement: Present. Denies leaking of fluid.   The following portions of the patient's history were reviewed and updated as appropriate: allergies, current medications, past family history, past medical history, past social history, past surgical history and problem list. Problem list updated.  Objective:   Vitals:   10/20/17 1316  BP: 112/69  Pulse: 67  Weight: 139 lb 6.4 oz (63.2 kg)    Fetal Status: Fetal Heart Rate (bpm): 147 Fundal Height: 38 cm Movement: Present  Presentation: Vertex  General:  Alert, oriented and cooperative. Patient is in no acute distress.  Skin: Skin is warm and dry. No rash noted.   Cardiovascular: Normal heart rate noted  Respiratory: Normal respiratory effort, no problems with respiration noted  Abdomen: Soft, gravid, appropriate for gestational age.  Pain/Pressure: Present     Pelvic: Cervical exam performed Dilation: Fingertip Effacement (%): Thick Station: Ballotable  Extremities: Normal range of motion.  Edema: None  Mental Status: Normal mood and affect. Normal behavior. Normal judgment and thought content.   Assessment and Plan:  Pregnancy: G2P0010 at 3176w4d  1. Supervision of other normal pregnancy,  antepartum FHT and FH normal  2. LGSIL on Pap smear of cervix Colposcopy postpartum  Term labor symptoms and general obstetric precautions including but not limited to vaginal bleeding, contractions, leaking of fluid and fetal movement were reviewed in detail with the patient. Please refer to After Visit Summary for other counseling recommendations.  Return in about 10 days (around 10/30/2017).  Future Appointments  Date Time Provider Department Center  10/27/2017  1:15 PM Adam PhenixArnold, James G, [facility]  10/31/2017  6:30 AM [facility] ROOM [facility] None  11/03/2017  8:15 AM Anyanwu, Jethro BastosUgonna A, MD [facility]    Levie HeritageJacob J Caddie Randle, DO

## 2017-10-20 NOTE — Progress Notes (Signed)
Pt states a lot of vaginal pain when baby moves.

## 2017-10-21 ENCOUNTER — Telehealth (HOSPITAL_COMMUNITY): Payer: Self-pay | Admitting: *Deleted

## 2017-10-21 NOTE — Telephone Encounter (Signed)
Preadmission screen  

## 2017-10-27 ENCOUNTER — Encounter: Payer: Self-pay | Admitting: Obstetrics & Gynecology

## 2017-10-29 ENCOUNTER — Telehealth (HOSPITAL_COMMUNITY): Payer: Self-pay | Admitting: *Deleted

## 2017-10-29 ENCOUNTER — Encounter (HOSPITAL_COMMUNITY): Payer: Self-pay | Admitting: *Deleted

## 2017-10-29 NOTE — Telephone Encounter (Signed)
Preadmission screen  

## 2017-10-30 ENCOUNTER — Ambulatory Visit (INDEPENDENT_AMBULATORY_CARE_PROVIDER_SITE_OTHER): Payer: Medicaid Other | Admitting: *Deleted

## 2017-10-30 ENCOUNTER — Ambulatory Visit (INDEPENDENT_AMBULATORY_CARE_PROVIDER_SITE_OTHER): Payer: Medicaid Other | Admitting: Obstetrics & Gynecology

## 2017-10-30 VITALS — BP 113/71 | HR 80 | Wt 140.0 lb

## 2017-10-30 DIAGNOSIS — Z3480 Encounter for supervision of other normal pregnancy, unspecified trimester: Secondary | ICD-10-CM

## 2017-10-30 DIAGNOSIS — Z3483 Encounter for supervision of other normal pregnancy, third trimester: Secondary | ICD-10-CM

## 2017-10-30 NOTE — Progress Notes (Signed)
   PRENATAL VISIT NOTE  Subjective:  Erin Sullivan is a 27 y.o. G2P0010 at 7144w0d being seen today for ongoing prenatal care.  She is currently monitored for the following issues for this low-risk pregnancy and has Severe recurrent major depression without psychotic features (HCC); Alcohol abuse; Major depressive disorder, recurrent episode, severe (HCC); Alcohol abuse with intoxication (HCC); Suicidal ideation; Severe episode of recurrent major depressive disorder, without psychotic features (HCC); Supervision of other normal pregnancy, antepartum; History of depression; Bipolar disorder (HCC); and LGSIL on Pap smear of cervix on their problem list.  Patient reports no complaints.  Contractions: Irregular. Vag. Bleeding: None.  Movement: Present. Denies leaking of fluid.   The following portions of the patient's history were reviewed and updated as appropriate: allergies, current medications, past family history, past medical history, past social history, past surgical history and problem list. Problem list updated.  Objective:   Vitals:   10/30/17 1536  BP: 113/71  Pulse: 80  Weight: 140 lb (63.5 kg)    Fetal Status: Fetal Heart Rate (bpm): NST   Movement: Present     General:  Alert, oriented and cooperative. Patient is in no acute distress.  Skin: Skin is warm and dry. No rash noted.   Cardiovascular: Normal heart rate noted  Respiratory: Normal respiratory effort, no problems with respiration noted  Abdomen: Soft, gravid, appropriate for gestational age.  Pain/Pressure: Present     Pelvic: Cervical exam performed        Extremities: Normal range of motion.  Edema: None  Mental Status:  Normal mood and affect. Normal behavior. Normal judgment and thought content.  Procedure: Patient informed of R/B/A of procedure. NST was performed and was reactive prior to procedure. NST:  EFM: Baseline: 140 bpm, Variability: Good {> 6 bpm), Accelerations: Reactive and Decelerations:  Absent Toco: irregular, every 7-8 minutes Procedure done to begin ripening of the cervix prior to admission for induction of labor. Appropriate time out taken. The patient was placed in the lithotomy position and the cervix brought into view with sterile speculum. A ring forcep was used to guide the 16F foley balloon through the internal os of the cervix. Foley Balloon filled with 60cc of sterile water. Plug inserted into end of the foley. Foley placed on tension and taped to medical thigh.  NST:  EFM Baseline: 150 bpm, Variability: Good {> 6 bpm), Accelerations: Reactive and Decelerations: Absent  Toco: none There were no signs of tachysystole or hypertonus. All equipment was removed and accounted for. The patient tolerated the procedure well.  Assessment and Plan:  Pregnancy: G2P0010 at 1444w0d 1. Supervision of other normal pregnancy, antepartum IOL tomorrow   S/p Outpatient placement of foley balloon catheter for cervical ripening. Induction of labor scheduled for tomorrow at 0630 am. Reassuring FHR tracing with no concerns at present. Warning signs given to patient to include return to [facility]U for heavy vaginal bleeding, Rupture of membranes, painful uterine contractions q 5 mins or less, severe abdominal discomfort, decreased fetal movement.  Return if symptoms worsen or fail to improve, for postpartum.   Scheryl DarterJames Pelham Hennick, MD 10/30/2017 4:49 PM

## 2017-10-30 NOTE — Patient Instructions (Signed)
Labor Induction Labor induction is when steps are taken to cause a pregnant woman to begin the labor process. Most women go into labor on their own between 37 weeks and 42 weeks of the pregnancy. When this does not happen or when there is a medical need, methods may be used to induce labor. Labor induction causes a pregnant woman's uterus to contract. It also causes the cervix to soften (ripen), open (dilate), and thin out (efface). Usually, labor is not induced before 39 weeks of the pregnancy unless there is a problem with the baby or mother. Before inducing labor, your health care provider will consider a number of factors, including the following:  The medical condition of you and the baby.  How many weeks along you are.  The status of the baby's lung maturity.  The condition of the cervix.  The position of the baby. What are the reasons for labor induction? Labor may be induced for the following reasons:  The health of the baby or mother is at risk.  The pregnancy is overdue by 1 week or more.  The water breaks but labor does not start on its own.  The mother has a health condition or serious illness, such as high blood pressure, infection, placental abruption, or diabetes.  The amniotic fluid amounts are low around the baby.  The baby is distressed. Convenience or wanting the baby to be born on a certain date is not a reason for inducing labor. What methods are used for labor induction? Several methods of labor induction may be used, such as:  Prostaglandin medicine. This medicine causes the cervix to dilate and ripen. The medicine will also start contractions. It can be taken by mouth or by inserting a suppository into the vagina.  Inserting a thin tube (catheter) with a balloon on the end into the vagina to dilate the cervix. Once inserted, the balloon is expanded with water, which causes the cervix to open.  Stripping the membranes. Your health care provider separates  amniotic sac tissue from the cervix, causing the cervix to be stretched and causing the release of a hormone called progesterone. This may cause the uterus to contract. It is often done during an office visit. You will be sent home to wait for the contractions to begin. You will then come in for an induction.  Breaking the water. Your health care provider makes a hole in the amniotic sac using a small instrument. Once the amniotic sac breaks, contractions should begin. This may still take hours to see an effect.  Medicine to trigger or strengthen contractions. This medicine is given through an IV access tube inserted into a vein in your arm. All of the methods of induction, besides stripping the membranes, will be done in the hospital. Induction is done in the hospital so that you and the baby can be carefully monitored. How long does it take for labor to be induced? Some inductions can take up to 2-3 days. Depending on the cervix, it usually takes less time. It takes longer when you are induced early in the pregnancy or if this is your first pregnancy. If a mother is still pregnant and the induction has been going on for 2-3 days, either the mother will be sent home or a cesarean delivery will be needed. What are the risks associated with labor induction? Some of the risks of induction include:  Changes in fetal heart rate, such as too high, too low, or erratic.  Fetal distress.    Chance of infection for the mother and baby.  Increased chance of having a cesarean delivery.  Breaking off (abruption) of the placenta from the uterus (rare).  Uterine rupture (very rare). When induction is needed for medical reasons, the benefits of induction may outweigh the risks. What are some reasons for not inducing labor? Labor induction should not be done if:  It is shown that your baby does not tolerate labor.  You have had previous surgeries on your uterus, such as a myomectomy or the removal of  fibroids.  Your placenta lies very low in the uterus and blocks the opening of the cervix (placenta previa).  Your baby is not in a head-down position.  The umbilical cord drops down into the birth canal in front of the baby. This could cut off the baby's blood and oxygen supply.  You have had a previous cesarean delivery.  There are unusual circumstances, such as the baby being extremely premature. This information is not intended to replace advice given to you by your health care provider. Make sure you discuss any questions you have with your health care provider. Document Released: 08/14/2006 Document Revised: 08/31/2015 Document Reviewed: 10/22/2012 Elsevier Interactive Patient Education  2017 [employer] Inc[employer]  

## 2017-10-30 NOTE — Progress Notes (Signed)
NST reactive before and after Foley bulb insertion 

## 2017-10-31 ENCOUNTER — Inpatient Hospital Stay (HOSPITAL_COMMUNITY)
Admission: AD | Admit: 2017-10-31 | Discharge: 2017-11-02 | DRG: 807 | Disposition: A | Discharge status: Home / Self Care | Payer: Medicaid Other | Attending: Obstetrics and Gynecology | Admitting: Obstetrics and Gynecology

## 2017-10-31 ENCOUNTER — Inpatient Hospital Stay (HOSPITAL_COMMUNITY): Payer: Medicaid Other | Admitting: Anesthesiology

## 2017-10-31 ENCOUNTER — Other Ambulatory Visit: Payer: Self-pay

## 2017-10-31 ENCOUNTER — Encounter (HOSPITAL_COMMUNITY): Payer: Self-pay

## 2017-10-31 ENCOUNTER — Inpatient Hospital Stay (HOSPITAL_COMMUNITY)
Admission: RE | Admit: 2017-10-31 | Discharge: 2017-10-31 | Disposition: A | Discharge status: Home / Self Care | Payer: Medicaid Other | Source: Ambulatory Visit | Attending: Family Medicine | Admitting: Family Medicine

## 2017-10-31 DIAGNOSIS — O480 Post-term pregnancy: Secondary | ICD-10-CM | POA: Diagnosis present

## 2017-10-31 DIAGNOSIS — Z3A40 40 weeks gestation of pregnancy: Secondary | ICD-10-CM

## 2017-10-31 DIAGNOSIS — F319 Bipolar disorder, unspecified: Secondary | ICD-10-CM

## 2017-10-31 DIAGNOSIS — F332 Major depressive disorder, recurrent severe without psychotic features: Secondary | ICD-10-CM

## 2017-10-31 LAB — CBC
HCT: 37.1 % (ref 36.0–46.0)
Hemoglobin: 12.7 g/dL (ref 12.0–15.0)
MCH: 30.9 pg (ref 26.0–34.0)
MCHC: 34.2 g/dL (ref 30.0–36.0)
MCV: 90.3 fL (ref 78.0–100.0)
PLATELETS: 200 10*3/uL (ref 150–400)
RBC: 4.11 MIL/uL (ref 3.87–5.11)
RDW: 14.4 % (ref 11.5–15.5)
WBC: 13 10*3/uL — AB (ref 4.0–10.5)

## 2017-10-31 LAB — TYPE AND SCREEN
ABO/RH(D): O POS
ANTIBODY SCREEN: NEGATIVE

## 2017-10-31 LAB — ABO/RH: ABO/RH(D): O POS

## 2017-10-31 LAB — RPR: RPR: NONREACTIVE

## 2017-10-31 MED ORDER — SENNOSIDES-DOCUSATE SODIUM 8.6-50 MG PO TABS
2.0000 | ORAL_TABLET | ORAL | Status: DC
  Administered 2017-10-31 – 2017-11-01 (×2): 2 via ORAL
  Filled 2017-10-31 (×2): qty 2

## 2017-10-31 MED ORDER — LACTATED RINGERS IV SOLN
500.0000 mL | Freq: Once | INTRAVENOUS | Status: AC
  Administered 2017-10-31: 500 mL via INTRAVENOUS

## 2017-10-31 MED ORDER — TERBUTALINE SULFATE 1 MG/ML IJ SOLN
0.2500 mg | Freq: Once | INTRAMUSCULAR | Status: DC | PRN
  Filled 2017-10-31: qty 1

## 2017-10-31 MED ORDER — PRENATAL MULTIVITAMIN CH
1.0000 | ORAL_TABLET | Freq: Every day | ORAL | Status: DC
  Administered 2017-11-01 – 2017-11-02 (×2): 1 via ORAL
  Filled 2017-10-31 (×2): qty 1

## 2017-10-31 MED ORDER — FENTANYL CITRATE (PF) 100 MCG/2ML IJ SOLN: 50.0000 ug | INTRAMUSCULAR | Status: DC | PRN

## 2017-10-31 MED ORDER — OXYCODONE-ACETAMINOPHEN 5-325 MG PO TABS
2.0000 | ORAL_TABLET | ORAL | Status: DC | PRN
Start: 2017-10-31 — End: 2017-10-31

## 2017-10-31 MED ORDER — EPHEDRINE 5 MG/ML INJ
10.0000 mg | INTRAVENOUS | Status: DC | PRN
  Filled 2017-10-31: qty 2

## 2017-10-31 MED ORDER — ONDANSETRON HCL 4 MG/2ML IJ SOLN
4.0000 mg | Freq: Four times a day (QID) | INTRAMUSCULAR | Status: DC | PRN
  Administered 2017-10-31 (×2): 4 mg via INTRAVENOUS
  Filled 2017-10-31 (×3): qty 2

## 2017-10-31 MED ORDER — OXYTOCIN 40 UNITS IN LACTATED RINGERS INFUSION - SIMPLE MED
1.0000 m[IU]/min | INTRAVENOUS | Status: DC
  Administered 2017-10-31: 2 m[IU]/min via INTRAVENOUS

## 2017-10-31 MED ORDER — SIMETHICONE 80 MG PO CHEW: 80.0000 mg | CHEWABLE_TABLET | ORAL | Status: DC | PRN

## 2017-10-31 MED ORDER — PHENYLEPHRINE 40 MCG/ML (10ML) SYRINGE FOR IV PUSH (FOR BLOOD PRESSURE SUPPORT)
80.0000 ug | PREFILLED_SYRINGE | INTRAVENOUS | Status: DC | PRN
  Filled 2017-10-31: qty 5

## 2017-10-31 MED ORDER — ONDANSETRON HCL 4 MG/2ML IJ SOLN: 4.0000 mg | INTRAMUSCULAR | Status: DC | PRN

## 2017-10-31 MED ORDER — WITCH HAZEL-GLYCERIN EX PADS: 1.0000 "application " | MEDICATED_PAD | CUTANEOUS | Status: DC | PRN

## 2017-10-31 MED ORDER — LIDOCAINE HCL (PF) 1 % IJ SOLN
30.0000 mL | INTRAMUSCULAR | Status: DC | PRN
  Filled 2017-10-31: qty 30

## 2017-10-31 MED ORDER — ZOLPIDEM TARTRATE 5 MG PO TABS: 5.0000 mg | ORAL_TABLET | Freq: Every evening | ORAL | Status: DC | PRN

## 2017-10-31 MED ORDER — TETANUS-DIPHTH-ACELL PERTUSSIS 5-2.5-18.5 LF-MCG/0.5 IM SUSP: 0.5000 mL | Freq: Once | INTRAMUSCULAR | Status: DC

## 2017-10-31 MED ORDER — PHENYLEPHRINE 40 MCG/ML (10ML) SYRINGE FOR IV PUSH (FOR BLOOD PRESSURE SUPPORT)
PREFILLED_SYRINGE | INTRAVENOUS | Status: AC
  Filled 2017-10-31: qty 10

## 2017-10-31 MED ORDER — OXYTOCIN BOLUS FROM INFUSION
500.0000 mL | Freq: Once | INTRAVENOUS | Status: AC
  Administered 2017-10-31: 500 mL via INTRAVENOUS

## 2017-10-31 MED ORDER — FENTANYL 2.5 MCG/ML BUPIVACAINE 1/10 % EPIDURAL INFUSION (WH - ANES)
14.0000 mL/h | INTRAMUSCULAR | Status: DC | PRN
  Administered 2017-10-31 (×3): 14 mL/h via EPIDURAL
  Filled 2017-10-31 (×2): qty 100

## 2017-10-31 MED ORDER — ACETAMINOPHEN 325 MG PO TABS: 650.0000 mg | ORAL_TABLET | ORAL | Status: DC | PRN

## 2017-10-31 MED ORDER — DIPHENHYDRAMINE HCL 25 MG PO CAPS: 25.0000 mg | ORAL_CAPSULE | Freq: Four times a day (QID) | ORAL | Status: DC | PRN

## 2017-10-31 MED ORDER — FENTANYL 2.5 MCG/ML BUPIVACAINE 1/10 % EPIDURAL INFUSION (WH - ANES)
INTRAMUSCULAR | Status: AC
  Filled 2017-10-31: qty 100

## 2017-10-31 MED ORDER — DIBUCAINE 1 % RE OINT: 1.0000 "application " | TOPICAL_OINTMENT | RECTAL | Status: DC | PRN

## 2017-10-31 MED ORDER — COCONUT OIL OIL: 1.0000 "application " | TOPICAL_OIL | Status: DC | PRN

## 2017-10-31 MED ORDER — SOD CITRATE-CITRIC ACID 500-334 MG/5ML PO SOLN: 30.0000 mL | ORAL | Status: DC | PRN

## 2017-10-31 MED ORDER — LACTATED RINGERS IV SOLN
INTRAVENOUS | Status: DC
  Administered 2017-10-31 (×2): via INTRAVENOUS

## 2017-10-31 MED ORDER — LIDOCAINE HCL (PF) 1 % IJ SOLN
INTRAMUSCULAR | Status: DC | PRN
  Administered 2017-10-31 (×2): 5 mL via EPIDURAL

## 2017-10-31 MED ORDER — DIPHENHYDRAMINE HCL 50 MG/ML IJ SOLN: 12.5000 mg | INTRAMUSCULAR | Status: DC | PRN

## 2017-10-31 MED ORDER — OXYCODONE-ACETAMINOPHEN 5-325 MG PO TABS: 1.0000 | ORAL_TABLET | ORAL | Status: DC | PRN

## 2017-10-31 MED ORDER — ACETAMINOPHEN 325 MG PO TABS
650.0000 mg | ORAL_TABLET | ORAL | Status: DC | PRN
  Administered 2017-10-31: 650 mg via ORAL
  Filled 2017-10-31: qty 2

## 2017-10-31 MED ORDER — FLEET ENEMA 7-19 GM/118ML RE ENEM: 1.0000 | ENEMA | RECTAL | Status: DC | PRN

## 2017-10-31 MED ORDER — BENZOCAINE-MENTHOL 20-0.5 % EX AERO
1.0000 "application " | INHALATION_SPRAY | CUTANEOUS | Status: DC | PRN
  Administered 2017-10-31: 1 via TOPICAL
  Filled 2017-10-31: qty 56

## 2017-10-31 MED ORDER — IBUPROFEN 600 MG PO TABS
600.0000 mg | ORAL_TABLET | Freq: Four times a day (QID) | ORAL | Status: DC
  Administered 2017-10-31 – 2017-11-02 (×7): 600 mg via ORAL
  Filled 2017-10-31 (×7): qty 1

## 2017-10-31 MED ORDER — OXYTOCIN 40 UNITS IN LACTATED RINGERS INFUSION - SIMPLE MED
2.5000 [IU]/h | INTRAVENOUS | Status: DC
  Administered 2017-10-31: 2.5 [IU]/h via INTRAVENOUS
  Filled 2017-10-31: qty 1000

## 2017-10-31 MED ORDER — ONDANSETRON HCL 4 MG PO TABS: 4.0000 mg | ORAL_TABLET | ORAL | Status: DC | PRN

## 2017-10-31 MED ORDER — LACTATED RINGERS IV SOLN
500.0000 mL | INTRAVENOUS | Status: DC | PRN
  Administered 2017-10-31: 500 mL via INTRAVENOUS

## 2017-10-31 MED ORDER — HYDROXYZINE HCL 50 MG PO TABS
50.0000 mg | ORAL_TABLET | Freq: Four times a day (QID) | ORAL | Status: DC | PRN
  Filled 2017-10-31: qty 1

## 2017-10-31 NOTE — Lactation Note (Signed)
This note was copied from a baby's chart. Lactation Consultation Note Baby 3 hrs old. Mom had put baby to breast in football position. Wide space between baby and breast noting baby pulling nipple. Mom stated pain. Baby swaddled in blanket. LC unwrapped baby, placed STS. Repositioned baby. Support pillows adjusted. Assisted in latch.  Mom has everted nipples w/bulbous areola. Hand expression demonstrated drops of colostrum.  NMom encouraged to feed baby 8-12 times/24 hours and with feeding cues. Mom encouraged to waken baby for feeds if baby hasn't cued in 3 hrs.  Newborn feeding habits, STS, I&O, breast massage, assessing for transfer, supply and demand discussed.  Mom getting sleepy. Answered questions mom had. Encouraged to call for assistance or questions. WH/LC brochure given w/resources, support groups and LC services.  Patient Name: Erin Sullivan RUEAV'WToday's Date: 10/31/2017 Reason for consult: Initial assessment;1st time breastfeeding   Maternal Data Has patient been taught Hand Expression?: Yes Does the patient have breastfeeding experience prior to this delivery?: No  Feeding Feeding Type: Breast Fed Length of feed: 10 min(still BF)  LATCH Score Latch: Grasps breast easily, tongue down, lips flanged, rhythmical sucking.  Audible Swallowing: A few with stimulation  Type of Nipple: Everted at rest and after stimulation  Comfort (Breast/Nipple): Soft / non-tender  Hold (Positioning): Assistance needed to correctly position infant at breast and maintain latch.  LATCH Score: 8  Interventions Interventions: Breast feeding basics reviewed;Support pillows;Assisted with latch;Position options;Skin to skin;Breast massage;Hand express;Breast compression;Adjust position  Lactation Tools Discussed/Used     Consult Status Consult Status: Follow-up Date: 11/01/17 Follow-up type: In-patient    Charyl DancerCARVER, Derrill Bagnell G 10/31/2017, 11:32 PM

## 2017-10-31 NOTE — Progress Notes (Signed)
Labor Progress Note Erin LewRosanna Sullivan is a 27 y.o. G2P0010 at 679w1d presented for IOL   S:  Patient has no complains and has no headache, nausea, vomiting or visual changes. Denies RUQ pain and says that she is comfortable.   O:  BP 104/62   Pulse 69   Temp 98.1 F (36.7 C) (Oral)   Resp 16   Ht 5\' 4"  (1.626 m)   Wt 64.4 kg (142 lb)   LMP 01/23/2017 (Exact Date)   SpO2 100%   BMI 24.37 kg/m  EFM: baseline 120 bpm/ reactive variability/ positive accels/ early decels  Toco: 1 in 3 mins avg SVE: Dilation: 5 Effacement (%): 70 Station: -3 Presentation: Vertex Exam by:: Lorn Junes. Goodman, RN Pitocin: 2 mu/min  A/P: 27 y.o. G2P0010 [redacted]w[redacted]d  1. Labor: Late Latent progressing  2. FWB: Cat 1 3. Pain: moderate   Patient is IOL on Pitocin Anticipate NSVD Monitoring of Maternal fetal well being  Monitor progression Expectant management.  Sandi RavelingAnson B Nadir Vasques, MD 11:18 AM

## 2017-10-31 NOTE — Anesthesia Pain Management Evaluation Note (Signed)
  CRNA Pain Management Visit Note  Patient: Erin Sullivan, 27 y.o., female  "[facility]. We have an anesthesia team available at all times to provide care throughout the hospital, including epidural management and anesthesia for C-section. I don't know your plan for the delivery whether it a natural birth, water birth, IV sedation, nitrous supplementation, doula or epidural, but we want to meet your pain goals."   1.Was your pain managed to your expectations on prior hospitalizations?   Yes   2.What is your expectation for pain management during this hospitalization?     Epidural  3.How can we help you reach that goal?   Record the patient's initial score and the patient's pain goal.   Pain: 0  Pain Goal: 4 [facility] wants you to be able to say your pain was always managed very well.  Erin Sullivan,Erin Sullivan 10/31/2017

## 2017-10-31 NOTE — Anesthesia Procedure Notes (Signed)
Epidural Patient location during procedure: OB  Staffing Anesthesiologist: Lekesha Claw, MD Performed: anesthesiologist   Preanesthetic Checklist Completed: patient identified, site marked, surgical consent, pre-op evaluation, timeout performed, IV checked, risks and benefits discussed and monitors and equipment checked  Epidural Patient position: sitting Prep: DuraPrep Patient monitoring: heart rate, continuous pulse ox and blood pressure Approach: right paramedian Location: L3-L4 Injection technique: LOR saline  Needle:  Needle type: Tuohy  Needle gauge: 17 G Needle length: 9 cm and 9 Needle insertion depth: 6 cm Catheter type: closed end flexible Catheter size: 20 Guage Catheter at skin depth: 10 cm Test dose: negative  Assessment Events: blood not aspirated, injection not painful, no injection resistance, negative IV test and no paresthesia  Additional Notes Patient identified. Risks/Benefits/Options discussed with patient including but not limited to bleeding, infection, nerve damage, paralysis, failed block, incomplete pain control, headache, blood pressure changes, nausea, vomiting, reactions to medication both or allergic, itching and postpartum back pain. Confirmed with bedside nurse the patient's most recent platelet count. Confirmed with patient that they are not currently taking any anticoagulation, have any bleeding history or any family history of bleeding disorders. Patient expressed understanding and wished to proceed. All questions were answered. Sterile technique was used throughout the entire procedure. Please see nursing notes for vital signs. Test dose was given through epidural needle and negative prior to continuing to dose epidural or start infusion. Warning signs of high block given to the patient including shortness of breath, tingling/numbness in hands, complete motor block, or any concerning symptoms with instructions to call for help. Patient was given  instructions on fall risk and not to get out of bed. All questions and concerns addressed with instructions to call with any issues.     

## 2017-10-31 NOTE — H&P (Signed)
Erin Sullivan is a 27 y.o. female G2P0010 with IUP at [redacted]w[redacted]d presenting for IOL for elective. [facility]   Prenatal History/Complications:   TAB  Past Medical History: Past Medical History:  Diagnosis Date  . Anxiety   . Depression     Past Surgical History: Past Surgical History:  Procedure Laterality Date  . Birthmark Removal     from posterior leg    Obstetrical History: OB History    Gravida  2   Para  0   Term  0   Preterm  0   AB  1   Living  0     SAB  0   TAB  1   Ectopic  0   Multiple  0   Live Births  0           Social History: Social History   Socioeconomic History  . Marital status: Married    Spouse name: Not on file  . Number of children: Not on file  . Years of education: Not on file  . Highest education level: Not on file  Occupational History  . Not on file  Social Needs  . Financial resource strain: Not on file  . Food insecurity:    Worry: Not on file    Inability: Not on file  . Transportation needs:    Medical: Not on file    Non-medical: Not on file  Tobacco Use  . Smoking status: Never Smoker  . Smokeless tobacco: Never Used  Substance and Sexual Activity  . Alcohol use: Not Currently  . Drug use: No    Comment: denied all dlrug use  . Sexual activity: Yes    Birth control/protection: None  Lifestyle  . Physical activity:    Days per week: Not on file    Minutes per session: Not on file  . Stress: Not on file  Relationships  . Social connections:    Talks on phone: Not on file    Gets together: Not on file    Attends religious service: Not on file    Active member of club or organization: Not on file    Attends meetings of clubs or organizations: Not on file    Relationship status: Not on file  Other Topics Concern  . Not on file  Social History Narrative  . Not on file    Family History: Family History  Problem Relation Age of Onset  . Depression Mother   . Diabetes Father      Allergies: No Known Allergies  Medications Prior to Admission  Medication Sig Dispense Refill Last Dose  . docusate sodium (COLACE) 100 MG capsule Take 1 capsule (100 mg total) by mouth 2 (two) times daily. 30 capsule 1 10/30/2017 at Unknown time  . Prenatal Multivit-Min-Fe-FA (PRENATAL VITAMINS PO) Take 1 tablet by mouth at bedtime.    10/30/2017 at Unknown time  . Melatonin 3 MG TABS Take 1 tablet by mouth daily.   Not Taking  . phenylephrine-shark liver oil-mineral oil-petrolatum (PREPARATION H) 0.25-3-14-71.9 % rectal ointment Place 1 application rectally 2 (two) times daily as needed for hemorrhoids.   Taking  . witch hazel-glycerin (TUCKS) pad Apply 1 application topically as needed for itching.   Taking        Review of Systems   Constitutional: Negative for fever and chills Eyes: Negative for visual disturbances Respiratory: Negative for shortness of breath, dyspnea Cardiovascular: Negative for chest pain or palpitations  Gastrointestinal: Negative for abdominal  pain, vomiting, diarrhea and constipation.   Genitourinary: Negative for dysuria and urgency Musculoskeletal: Negative for back pain, joint pain, myalgias  Neurological: Negative for dizziness and headaches      Blood pressure 110/68, pulse 63, temperature 98.7 F (37.1 C), temperature source Oral, resp. rate 18, height 5\' 4"  (1.626 m), weight 64.4 kg (142 lb), last menstrual period 01/23/2017, SpO2 100 %. General appearance: alert, cooperative and mild distress Lungs: clear to auscultation bilaterally Heart: regular rate and rhythm Abdomen: soft, non-tender; bowel sounds normal Extremities: Homans sign is negative, no sign of DVT DTR's 2+ Presentation: cephalic Fetal monitoring  Baseline: 145 bpm, Variability: Good {> 6 bpm), Accelerations: Reactive and Decelerations: Absent Uterine activity  4-5  Dilation: 2 Effacement (%): 50 Station: -3 Exam by:: Latricia HeftAnna Cioce, RN  Foley still in Prenatal  labs: ABO, Rh: O/Positive/-- (01/08 1012) Antibody: Negative (01/08 1012) Rubella: immune RPR: Non Reactive (05/07 0820)  HBsAg: Negative (01/08 1012)  HIV: Non Reactive (05/07 0820)  GBS:     [facility]  Prenatal Labs  Dating Certain LMP  Blood type: O/Positive/-- (01/08 1012)   Genetic Screen 1 Screen:   negative AFP:  negative    Antibody:Negative (01/08 1012)  Anatomic US Normal at 19 weeks Rubella: 7.18 (01/08 1012)  GTT Third trimester: 75/129/87 RPR: Non Reactive (01/08 1012)   Flu vaccine Declines  HBsAg: Negative (01/08 1012)   TDaP vaccine 08/12/17     HIV: negative  Baby Food Breast                                          GBS: Negative  Contraception ?POPs NWG:NFAOZHYQMPap:Collected 04/15/2017, LSIL, colpo PP  Circumcision Yes    Pediatrician Unsure    Support Person Buri(FOB)  and sister    Prenatal Classes Unsure.  Hgb electrophoresis:normal  2hr gtt: 75/129/87 Prenatal Transfer Tool  Maternal Diabetes: No Genetic Screening: Normal Maternal Ultrasounds/Referrals: Normal Fetal Ultrasounds or other Referrals:  None Maternal Substance Abuse:  No Significant Maternal Medications:  None Significant Maternal Lab Results: None    Results for orders placed or performed during the hospital encounter of 10/31/17 (from the past 24 hour(s))  CBC   Collection Time: 10/31/17  5:40 AM  Result Value Ref Range   WBC 13.0 (H) 4.0 - 10.5 K/uL   RBC 4.11 3.87 - 5.11 MIL/uL   Hemoglobin 12.7 12.0 - 15.0 g/dL   HCT 57.837.1 46.936.0 - 62.946.0 %   MCV 90.3 78.0 - 100.0 fL   MCH 30.9 26.0 - 34.0 pg   MCHC 34.2 30.0 - 36.0 g/dL   RDW 52.814.4 41.311.5 - 24.415.5 %   Platelets 200 150 - 400 K/uL    Assessment: Erin Sullivan is a 27 y.o. G2P0010 with an IUP at 1462w1d presenting for IOL for elective.  Plan: #Labor: when foley falls out, start pitocin if needed #Pain:  Per request #FWB Cat 1  Jacklyn ShellFrances Cresenzo-Dishmon 10/31/2017, 6:21 AM

## 2017-10-31 NOTE — MAU Note (Signed)
Pt reports contractions every 4-5 minutes for the last several hours. Pt denies LOF. Reports some bloody show. Pt is scheduled for induction at 0630 this morning and had foley bulb placed yesterday. Reports good fetal movement.

## 2017-10-31 NOTE — Progress Notes (Signed)
Erin Sullivan Setup 

## 2017-10-31 NOTE — Anesthesia Preprocedure Evaluation (Signed)

## 2017-10-31 NOTE — Progress Notes (Signed)
Labor Progress Note Erin Sullivan is a 27 y.o. G2P0010 at 324w1d presented for IOL pre post dates   S:  Patient has no complaints and is comfortable in bed.  O:  BP 113/63   Pulse 65   Temp 98.1 F (36.7 C) (Oral)   Resp 18   Ht 5\' 4"  (1.626 m)   Wt 64.4 kg (142 lb)   LMP 01/23/2017 (Exact Date)   SpO2 100%   BMI 24.37 kg/m  EFM: baseline 120 bpm/ moderate variability/ present accels/ no decels  Toco: 1 in 3 mins SVE: Complete and 10cm, Vertex at station +1  Pitocin: 8 mu/min  A/P: 27 y.o. G2P0010 3024w1d  1. Labor: Active 2. FWB: Cat 1 3. Pain: Moderate   Patient is on induction with pitocin post AROM Anticipate NSVD Patient is not feeling the urge to push yet. Position of patient adjusted for imminent labour. Expectant management  Monitoring of Maternal and fetal well being.Sandi Raveling.  Rolando Whitby B Lunetta Marina, MD 4:15 PM

## 2017-11-01 LAB — CBC
HEMATOCRIT: 30.5 % — AB (ref 36.0–46.0)
HEMOGLOBIN: 10.6 g/dL — AB (ref 12.0–15.0)
MCH: 31.4 pg (ref 26.0–34.0)
MCHC: 34.8 g/dL (ref 30.0–36.0)
MCV: 90.2 fL (ref 78.0–100.0)
Platelets: 162 10*3/uL (ref 150–400)
RBC: 3.38 MIL/uL — ABNORMAL LOW (ref 3.87–5.11)
RDW: 14.3 % (ref 11.5–15.5)
WBC: 27.1 10*3/uL — AB (ref 4.0–10.5)

## 2017-11-01 MED ORDER — DOCUSATE SODIUM 100 MG PO CAPS
100.0000 mg | ORAL_CAPSULE | Freq: Two times a day (BID) | ORAL | Status: DC
  Administered 2017-11-01 – 2017-11-02 (×2): 100 mg via ORAL
  Filled 2017-11-01 (×2): qty 1

## 2017-11-01 NOTE — Anesthesia Postprocedure Evaluation (Signed)
Anesthesia Post Note  Patient: Karma LewRosanna Cadotte  Procedure(s) Performed: AN AD HOC LABOR EPIDURAL     Patient location during evaluation: Mother Baby Anesthesia Type: Epidural Level of consciousness: awake and alert, oriented and patient cooperative Pain management: pain level controlled Vital Signs Assessment: post-procedure vital signs reviewed and stable Respiratory status: spontaneous breathing Cardiovascular status: stable Postop Assessment: no headache, epidural receding, patient able to bend at knees and no signs of nausea or vomiting Anesthetic complications: no Comments: Pain score 0.  Pt c/o right knee tingling to touch.    Last Vitals:  Vitals:   11/01/17 0422 11/01/17 0823  BP: 107/63 98/64  Pulse: (!) 53 (!) 58  Resp: 18 17  Temp: 36.5 C 36.6 C  SpO2: 100%     Last Pain:  Vitals:   11/01/17 0823  TempSrc: Axillary  PainSc:    Pain Goal: Patients Stated Pain Goal: 3 (10/31/17 [id number])               Merrilyn PumaWRINKLE,Olivia Royse

## 2017-11-01 NOTE — Progress Notes (Signed)
CSW received consult for [name] due to history of significant history of mental health diagnosis. Prior diagnoses include depression, major depressive disorder with psychotic features, and substance use. CSW met with [name] and [name] to assess needs. [name] reports that this is her first child. [name] reports that she receives Medicaid. CSW explained WIC program to [facility] and how to access resource. [name] reports that she was previously prescribed psychotropic medications but never took them. CSW inquired with [name] regarding not taking medications, [name] reported that her mother has significant mental health history and that the medications have not helped her so [name] did not want to take any medication. [name] reports great family support including FOB and her sister. [name] reports that Erin Sullivan will receive pediatric care at [facility] with Dr. Smitty Pluck. [name] is not currently receiving therapy but is interested in starting to see a counselor. CSW provided [name] with community resource list to begin her search to find a outpatient [place] provider. CSW encouraged [name] to reach out for assistance if needs or questions arise, [name] stated agreement.  Erin Sullivan, MSW, [place] Social Worker [facility] [phone]

## 2017-11-01 NOTE — Progress Notes (Signed)
   11/01/17 1800  Edinburgh Postnatal Depression Scale:  In the Past 7 Days  I have been able to laugh and see the funny side of things. 0  I have looked forward with enjoyment to things. 1  I have blamed myself unnecessarily when things went wrong. 2  I have been anxious or worried for no good reason. 3  I have felt scared or panicky for no good reason. 2  Things have been getting on top of me. 2  I have been so unhappy that I have had difficulty sleeping. 2  I have felt sad or miserable. 2  I have been so unhappy that I have been crying. 1  The thought of harming myself has occurred to me. 0  Edinburgh Postnatal Depression Scale Total 15

## 2017-11-01 NOTE — Progress Notes (Signed)
Post Partum Day #1 Subjective: no complaints, up ad lib and tolerating PO; breastfeeding going well; plans on Nexplanon for pp contraception  Objective: Blood pressure 98/64, pulse (!) 58, temperature 97.8 F (36.6 C), temperature source Axillary, resp. rate 17, height 5\' 4"  (1.626 m), weight 64.4 kg (142 lb), last menstrual period 01/23/2017, SpO2 100 %, unknown if currently breastfeeding.  Physical Exam:  General: alert, cooperative and no distress Lochia: appropriate Uterine Fundus: firm DVT Evaluation: No evidence of DVT seen on physical exam.  Recent Labs    10/31/17 0540 11/01/17 0622  HGB 12.7 10.6*  HCT 37.1 30.5*    Assessment/Plan: Plan for discharge tomorrow   LOS: 1 day   Cam HaiSHAW, Eliceo Gladu CNM 11/01/2017, 9:29 AM

## 2017-11-02 MED ORDER — IBUPROFEN 600 MG PO TABS: 600.0000 mg | ORAL_TABLET | Freq: Four times a day (QID) | ORAL | 0 refills | Status: DC

## 2017-11-02 MED ORDER — DOCUSATE SODIUM 100 MG PO CAPS: 100.0000 mg | ORAL_CAPSULE | Freq: Two times a day (BID) | ORAL | 1 refills | Status: DC

## 2017-11-02 NOTE — Progress Notes (Signed)
CSW received additional consult for [name] due to her EPDS score of 15. CSW met with [name] to discuss the assessment and [name] reports that she is just physically tired and overwhelmed with everything going on with having a newborn. [name] reports that FOB is helpful. [name] states she is doing okay and is ready to be discharged pending some lab results. [name] did not have any further concerns to address and expressed gratitude for CSW interaction. CSW reeducated [name] on baby blues period versus postpartum depression. CSW encouraged [name] to reach out for assistance at any time.  Madilyn Fireman, MSW, [place] Social Worker [facility] [phone]

## 2017-11-02 NOTE — Lactation Note (Signed)
This note was copied from a baby's chart. Lactation Consultation Note  Patient Name: Erin Sullivan ZOXWR'UToday's Date: 11/02/2017 Reason for consult: Follow-up assessment;Term;1st time breastfeeding;Primapara  Visited with P1 Mom of term infant at 7839 hrs old,  Baby at 5% weight loss.  Pediatrician finished with exam, and baby unwrapped and cueing.  Offered to assist/assess with latch.    Baby easily latched in cross cradle hold, LC added pillows for support.  Swallows identified for Mom.  Mom encouraged to feed baby STS on cue.  Goal of 8-12 feedings per 24 hrs.   Engorgement prevention and treatment discussed. Mom aware of OP Lactation support, including BFSG on Tuesdays.   Mom declined having any questions right now. Interventions Interventions: Breast feeding basics reviewed;Assisted with latch;Skin to skin;Breast massage;Hand express;Position options;Support pillows;Adjust position;Breast compression   Consult Status Consult Status: Complete Date: 11/02/17 Follow-up type: Call as needed    Judee ClaraSmith, Adhrit Krenz E 11/02/2017, 11:41 AM

## 2017-11-02 NOTE — Discharge Summary (Addendum)
OB Discharge Summary     Patient Name: Erin Sullivan DOB: Jan 13, 1991 MRN: [id number]  Date of admission: 10/31/2017 Delivering MD: Lennox PippinsALVIS, ANSON B   Date of discharge: 11/02/2017  Admitting diagnosis: 40.1 WEEKS CTX Intrauterine pregnancy: [redacted]w[redacted]d     Secondary diagnosis:  Active Problems:   Normal labor  Additional problems: none     Discharge diagnosis: Term Pregnancy Delivered                                                                                                Post partum procedures:none  Augmentation: AROM, Pitocin and Foley Balloon  Complications: None  Hospital course:  Induction of Labor With Vaginal Delivery   27 y.o. yo G2P1011 at 386w1d was admitted to the hospital 10/31/2017 for induction of labor.  Indication for induction: Postdates.  Patient had an uncomplicated labor course as follows: Membrane Rupture Time/Date: 2:06 PM ,10/31/2017   Intrapartum Procedures: Episiotomy: None [1]                                         Lacerations:  Periurethral [8];1st degree [2]  Patient had delivery of a Viable infant.  Information for the patient's newborn:  Jacklynn BarnacleVentura, Girl Breindel [[id number]][[id number]]  Delivery Method: Vaginal, Spontaneous(Filed from Delivery Summary)   10/31/2017  Details of delivery can be found in separate delivery note.  Patient had a routine postpartum course. Patient is discharged home 11/02/17.  Physical exam  Vitals:   11/01/17 0823 11/01/17 1133 11/01/17 2326 11/02/17 0520  BP: 98/64 110/74 111/86 110/71  Pulse: (!) 58 (!) 53 70 61  Resp: 17 17 18 18   Temp: 97.8 F (36.6 C) 97.9 F (36.6 C) 97.8 F (36.6 C) 98.5 F (36.9 C)  TempSrc: Axillary Axillary Oral Oral  SpO2:  99%  99%  Weight:      Height:       General: alert, cooperative and no distress Lochia: appropriate Uterine Fundus: firm Incision: N/A DVT Evaluation: No evidence of DVT seen on physical exam. No cords or calf tenderness. Labs: Lab Results  Component Value Date   WBC 27.1 (H) 11/01/2017   HGB 10.6 (L) 11/01/2017   HCT 30.5 (L) 11/01/2017   MCV 90.2 11/01/2017   PLT 162 11/01/2017   CMP Latest Ref Rng & Units 04/14/2015  Glucose 65 - 99 mg/dL 71  BUN 6 - 20 mg/dL 11  Creatinine 7.820.44 - 9.561.00 mg/dL 2.130.59  Sodium 086135 - 578145 mmol/L 145  Potassium 3.5 - 5.1 mmol/L 3.9  Chloride 101 - 111 mmol/L 113(H)  CO2 22 - 32 mmol/L 20(L)  Calcium 8.9 - 10.3 mg/dL 9.4  Total Protein 6.5 - 8.1 g/dL 7.6  Total Bilirubin 0.3 - 1.2 mg/dL 0.4  Alkaline Phos 38 - 126 U/L 64  AST 15 - 41 U/L 24  ALT 14 - 54 U/L 17    Discharge instruction: per After Visit Summary and "Baby and Me Booklet".  After visit meds:  Allergies as of 11/02/2017  No Known Allergies     Medication List    TAKE these medications   docusate sodium 100 MG capsule Commonly known as:  COLACE Take 1 capsule (100 mg total) by mouth 2 (two) times daily. What changed:  when to take this   ibuprofen 600 MG tablet Commonly known as:  ADVIL,MOTRIN Take 1 tablet (600 mg total) by mouth every 6 (six) hours.   phenylephrine-shark liver oil-mineral oil-petrolatum 0.25-3-14-71.9 % rectal ointment Commonly known as:  PREPARATION H Place 1 application rectally 2 (two) times daily as needed for hemorrhoids.   PRENATAL VITAMINS PO Take 1 tablet by mouth at bedtime.   witch hazel-glycerin pad Commonly known as:  TUCKS Apply 1 application topically as needed for itching.       Diet: routine diet  Activity: Advance as tolerated. Pelvic rest for 6 weeks.   Outpatient follow up:4 weeks Follow up Appt:No future appointments. Follow up Visit:No follow-ups on file.  Postpartum contraception: Progesterone only pills  Newborn Data: Live born female  Birth Weight: 6 lb 13 oz (3090 g) APGAR: 8, 9  Newborn Delivery   Birth date/time:  10/31/2017 20:27:00 Delivery type:  Vaginal, Spontaneous     Baby Feeding: Breast Disposition:home with mother   11/02/2017 Sandi Raveling, MD  I confirm  that I have verified the information documented in the resident's note and that I have also personally reperformed the physical exam and all medical decision making activities.  Sharyon Cable, CNM 11/04/17,

## 2017-11-03 ENCOUNTER — Encounter: Payer: Self-pay | Admitting: Obstetrics & Gynecology

## 2017-11-07 ENCOUNTER — Telehealth: Payer: Self-pay | Admitting: General Practice

## 2017-11-07 NOTE — Telephone Encounter (Signed)
Appt reminder mailed to patient

## 2017-11-12 ENCOUNTER — Telehealth: Payer: Self-pay | Admitting: General Practice

## 2017-11-12 ENCOUNTER — Telehealth: Payer: Self-pay

## 2017-11-12 NOTE — Telephone Encounter (Signed)
Pt called stating that she is returning call to [place].

## 2017-11-12 NOTE — Telephone Encounter (Signed)
Patient called & left message on nurse voicemail line stating there are some things going on since delivery & she isn't sure if it is normal or not.  Called patient, no answer- left message on voicemail stating we are trying to reach you to return your phone call. Please call back.

## 2017-11-17 ENCOUNTER — Ambulatory Visit (INDEPENDENT_AMBULATORY_CARE_PROVIDER_SITE_OTHER): Payer: Medicaid Other | Admitting: Clinical

## 2017-11-17 DIAGNOSIS — F4323 Adjustment disorder with mixed anxiety and depressed mood: Secondary | ICD-10-CM | POA: Diagnosis not present

## 2017-11-17 NOTE — BH Specialist Note (Signed)
Integrated Behavioral Health Follow Up Visit  MRN: [id number] Name: Erin LewRosanna Lamour  Number of Integrated Behavioral Health Clinician visits: 3/6 Session Start time: 10: 00  Session End time: 10:18 Total time: 20 minutes  Type of Service: Integrated Behavioral Health- Individual/Family Interpretor:No. Interpretor Name and Language: n/a  SUBJECTIVE: Erin Sullivan is a 27 y.o. female accompanied by n/a Patient was referred by Dr Earlene Plateravis (at last visit) for depression. Patient reports the following symptoms/concerns: Pt states she is feeling much better than one week prior, with primary symptom of fatigue and mild depression,   she [facility], has moved in with her mother postpartum for additional support, breastfeeding is going well, [facility],  and feels she is adjusting well to motherhood.  Duration of problem: -; Severity of problem: mild  OBJECTIVE: Mood: Normal and Affect: Appropriate Risk of harm to self or others: No plan to harm self or others  LIFE CONTEXT: Family and Social: Pt lives with her mother and newborn daughter(192week old) School/Work: Plans to return to work at a later date; husband works as Scientist, research (physical sciences)truck driver Self-Care: Established with routine therapy Life Changes: Recent childbirth  GOALS ADDRESSED: Patient will: 1.  Maintain reduction of symptoms of: depression and anxiety   INTERVENTIONS: Interventions utilized:  Supportive Counseling Standardized Assessments completed: Not Needed  ASSESSMENT: Patient currently experiencing Adjustment disorder with depressed and anxious mood.   Patient may benefit from brief supportive counseling today.  PLAN: 1. Follow up with behavioral health clinician on : As needed 2. Behavioral recommendations:  -Continue attending ongoing [facility] sessions at [facility] -Continue taking prenatal vitamins until postpartum visit with medical provider -[facility] 3. Referral(s): Integrated Behavioral Health Services (In Clinic) 4. "From scale of 1-10, how likely are you to follow plan?": 10  Rae LipsJamie C Tre Sanker, LCSW      Depression screen [facility] 2/[facility] 10/13/2017 10/06/2017 09/24/2017 09/12/2017 08/29/2017  Decreased Interest 1 2 1 1 1   Down, Depressed, Hopeless 2 2 1 1 1   PHQ - 2 Score 3 4 2 2 2   Altered sleeping 3 3 3 3 2   Tired, decreased energy 1 2 1 2 1   Change in appetite 1 1 1 1 1   Feeling bad or failure about yourself  1 2 1 2 1   Trouble concentrating 1 2 1 2 1   Moving slowly or fidgety/restless 0 0 0 0 0  Suicidal thoughts 0 0 0 0 0  PHQ-9 Score 10 14 9 12 8   Difficult doing work/chores - Somewhat difficult - - -   GAD 7 : Generalized Anxiety Score 10/13/2017 10/06/2017 09/24/2017 09/12/2017  Nervous, Anxious, on Edge 2 3 3 2   Control/stop worrying 2 3 2 2   Worry too much - different things 2 3 2 2   Trouble relaxing 1 2 1 2   Restless 0 1 1 1   Easily annoyed or irritable 0 1 0 1  Afraid - awful might happen 2 3 2 2   Total GAD 7 Score 9 16 11 12   Anxiety Difficulty - Somewhat difficult - -

## 2017-11-17 NOTE — Telephone Encounter (Signed)
I called Erin Sullivan and she reports she has been having some constipation/ pressure/ gas but is a little better now than when she called. States she has had problems with constipation before pregnancy and during pregnancy. Takes colace once a day , miralax as needed. States having bowel movements now more like her regular every 2 or 3 days.  Advised continue colace once a day or twice a day if needed, miralax as needed, be sure to get lots of water daily, diet rich in vegetables, fruit/ fiber. She voices understanding. Also reminded her of her pp appt.

## 2017-12-12 ENCOUNTER — Encounter: Payer: Self-pay | Admitting: Obstetrics & Gynecology

## 2017-12-12 ENCOUNTER — Ambulatory Visit (INDEPENDENT_AMBULATORY_CARE_PROVIDER_SITE_OTHER): Payer: Medicaid Other | Admitting: Obstetrics & Gynecology

## 2017-12-12 DIAGNOSIS — Z1389 Encounter for screening for other disorder: Secondary | ICD-10-CM

## 2017-12-12 MED ORDER — NORETHINDRONE 0.35 MG PO TABS: 1.0000 | ORAL_TABLET | Freq: Every day | ORAL | 11 refills | Status: DC

## 2017-12-12 NOTE — Progress Notes (Signed)
Subjective:     Erin Sullivan is a 27 y.o. female who presents for a postpartum visit. She is 6 weeks postpartum following a spontaneous vaginal delivery. I have fully reviewed the prenatal and intrapartum course. The delivery was at 40/1 gestational weeks. Outcome: spontaneous vaginal delivery. Anesthesia: epidural. Postpartum course has been normal. Baby's course has been normal. Baby is feeding by breast. Bleeding staining only. Bowel function is normal. Bladder function is normal. Patient is not sexually active. Contraception method is none. Postpartum depression screening: negative.  The following portions of the patient's history were reviewed and updated as appropriate: allergies, current medications, past family history, past medical history, past social history, past surgical history and problem list.  Review of Systems Pertinent items are noted in HPI.   Objective:    There were no vitals taken for this visit.  General:  alert   Breasts:  inspection negative, no nipple discharge or bleeding, no masses or nodularity palpable  Lungs: clear to auscultation bilaterally  Heart:  regular rate and rhythm, S1, S2 normal, no murmur, click, rub or gallop  Abdomen: soft, non-tender; bowel sounds normal; no masses,  no organomegaly   Vulva:  normal  Vagina: normal vagina  Cervix:  not evaluated  Corpus: not examined  Adnexa:  not evaluated  Rectal Exam: Not performed.        Assessment:    Normal postpartum exam. Pap smear not done at today's visit.   Plan:    1. Contraception: oral progesterone-only contraceptive 2. LGISL pap 1/19- she will need a colpo at her next visit Recommend condoms for the first 4 weeks of using the POPs

## 2017-12-12 NOTE — Progress Notes (Signed)
Pt is interested in [employer]

## 2018-01-16 ENCOUNTER — Ambulatory Visit (INDEPENDENT_AMBULATORY_CARE_PROVIDER_SITE_OTHER): Payer: Medicaid Other | Admitting: Obstetrics & Gynecology

## 2018-01-16 ENCOUNTER — Other Ambulatory Visit (HOSPITAL_COMMUNITY)
Admission: RE | Admit: 2018-01-16 | Discharge: 2018-01-16 | Disposition: A | Discharge status: Home / Self Care | Payer: Medicaid Other | Source: Ambulatory Visit | Attending: Obstetrics & Gynecology | Admitting: Obstetrics & Gynecology

## 2018-01-16 ENCOUNTER — Encounter: Payer: Self-pay | Admitting: Obstetrics & Gynecology

## 2018-01-16 VITALS — BP 125/80 | HR 58 | Wt 116.0 lb

## 2018-01-16 DIAGNOSIS — R87612 Low grade squamous intraepithelial lesion on cytologic smear of cervix (LGSIL): Secondary | ICD-10-CM | POA: Insufficient documentation

## 2018-01-16 NOTE — Progress Notes (Signed)
   Subjective:    Patient ID: Erin Sullivan, female    DOB: 1990-12-11, 27 y.o.   MRN: [id number]  HPI 27 yo P1 here for a colpo. She had a LGSIL pap 1/19 and then had a vaginal delivery 7/19.    Review of Systems She is breastfeeding and using POPs for contraception. Baby is sleeping well.    Objective:   Physical Exam Breathing, conversing, and ambulating normally UPT negative, consent signed, time out done Cervix prepped with acetic acid. Transformation zone seen in its entirety. Colpo adequate. All normal colpo ECC obtained. She tolerated the procedure well.     Assessment & Plan:  LGSIL pap- normal colpo If ECC is negative, then repeat pap in a year.

## 2018-01-20 ENCOUNTER — Encounter: Payer: Self-pay | Admitting: *Deleted

## 2018-01-21 ENCOUNTER — Telehealth: Payer: Self-pay | Admitting: *Deleted

## 2018-01-21 NOTE — Telephone Encounter (Signed)
Informed pt that she will need to have a pap smear in a year. Pt verbalized understanding.

## 2018-01-21 NOTE — Telephone Encounter (Signed)
-----   Message from Allie Bossier, MD sent at 01/21/2018  8:43 AM EDT ----- She will need a pap smear in a year. Thanks

## 2018-05-17 IMAGING — US US MFM OB COMP +14 WKS
1 series · 14 of 28 positions shown · non-contrast
Comparison: none

[Series 1: us mfm ob comp +14 wks · 83 acquisitions, 14 frames shown]
[im 4/83]
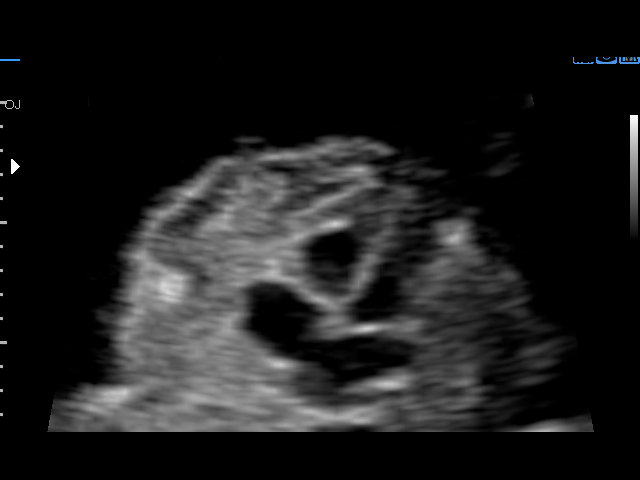
[im 10/83]
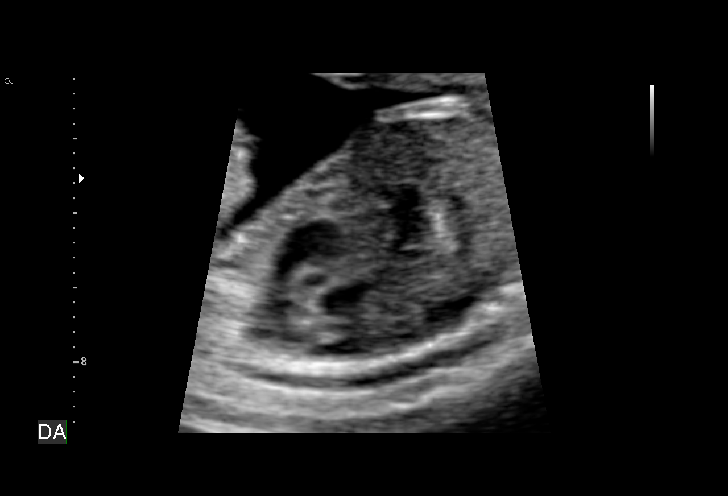
[im 16/83]
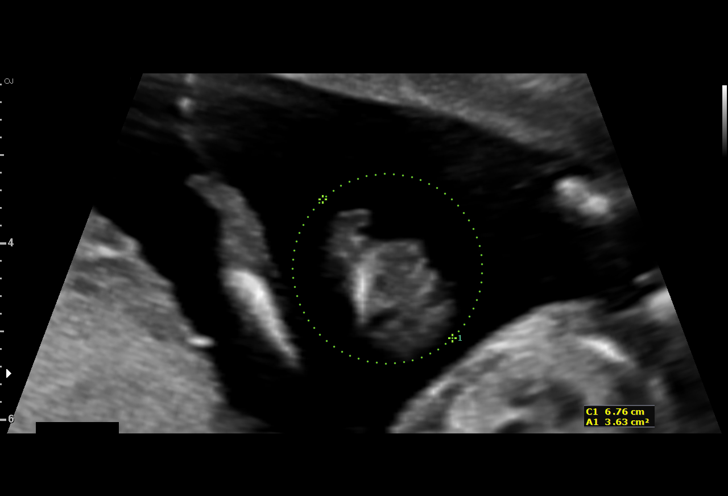
[im 22/83]
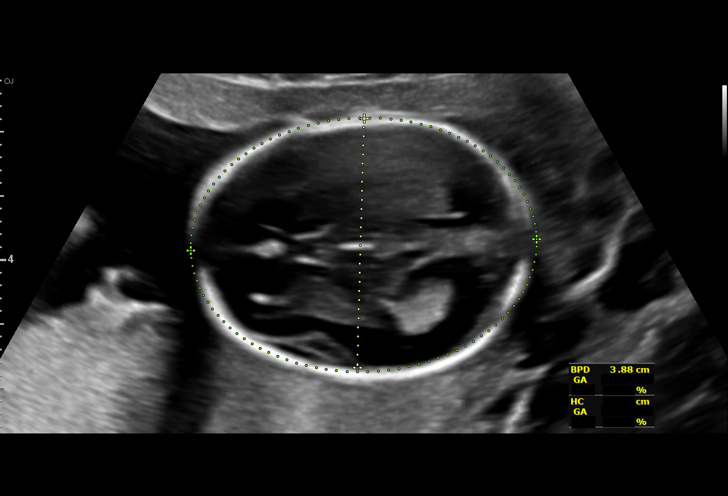
[im 28/83]
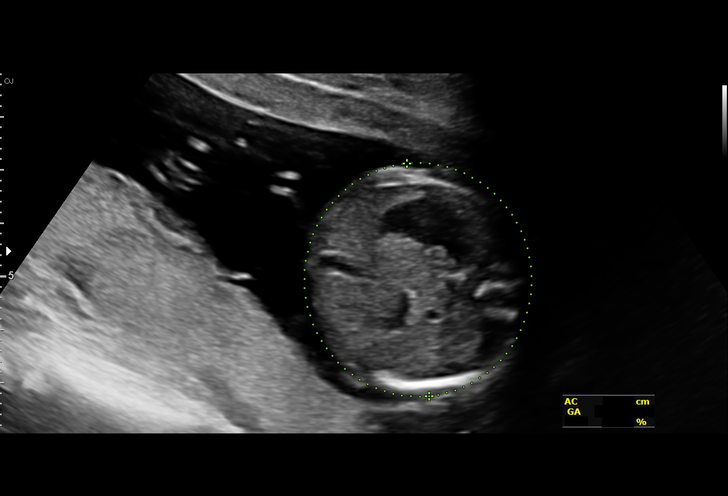
[im 34/83]
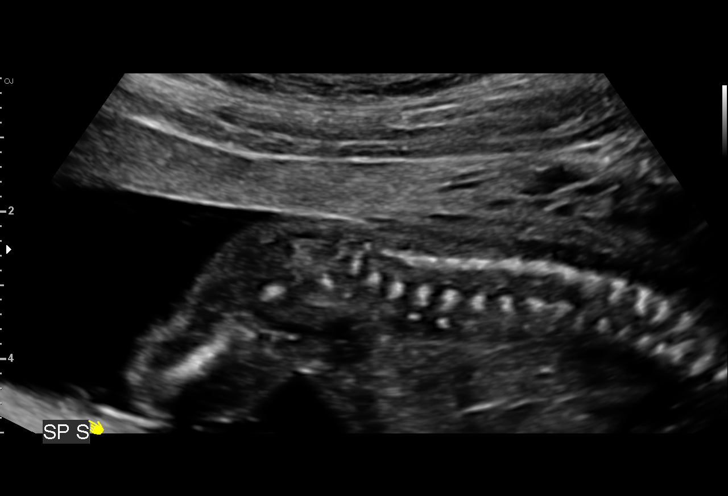
[im 40/83]
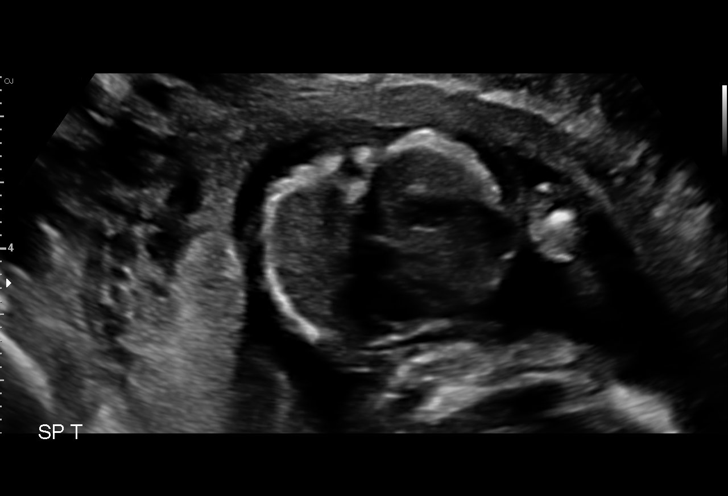
[im 46/83]
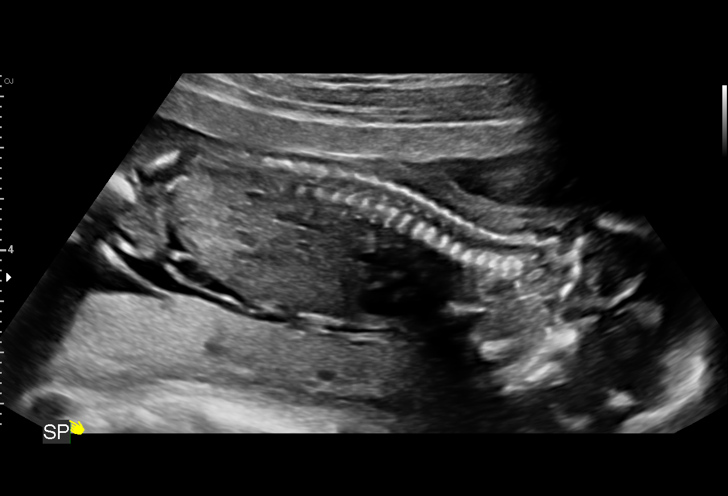
[im 52/83]
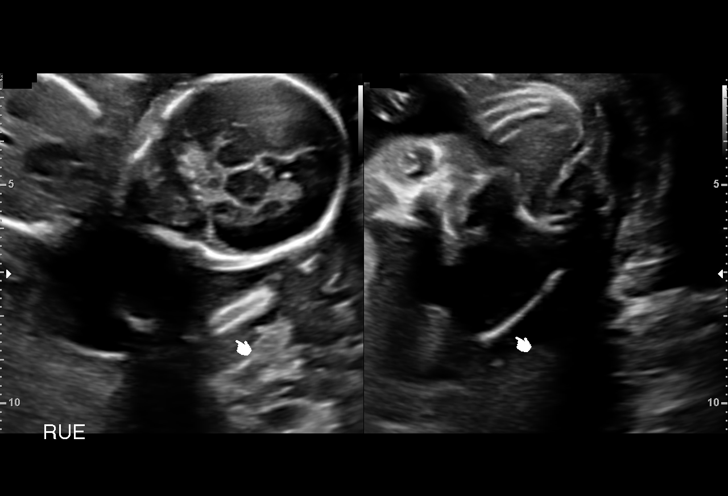
[im 58/83]
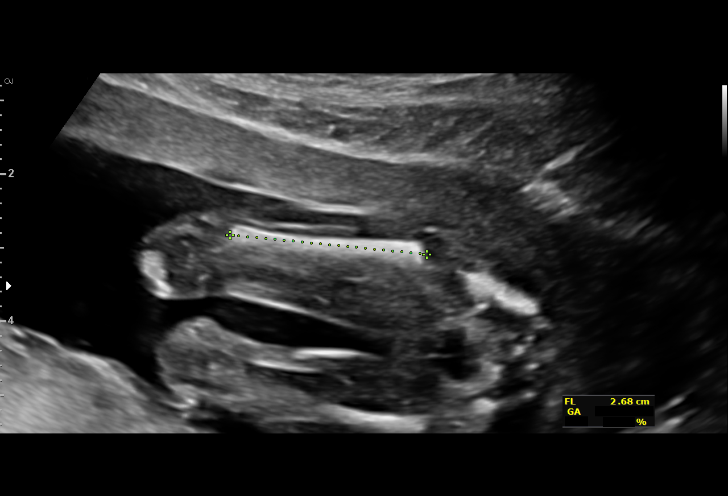
[im 64/83]
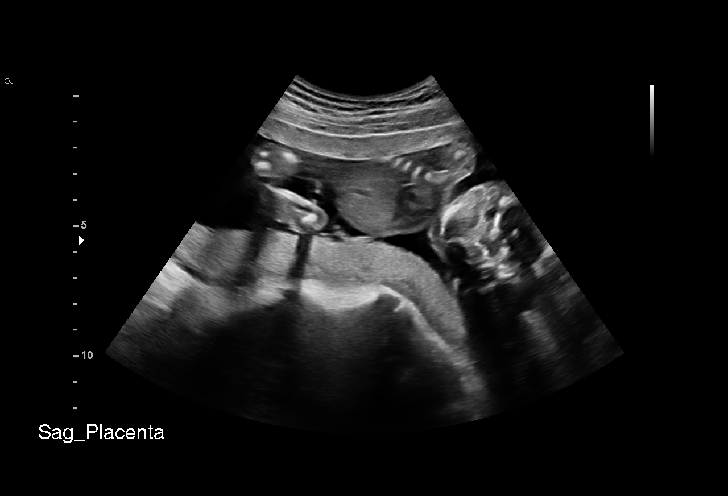
[im 70/83]
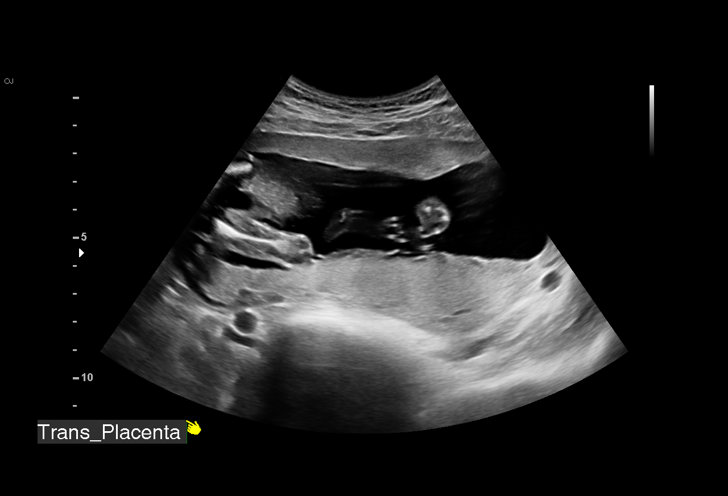
[im 76/83]
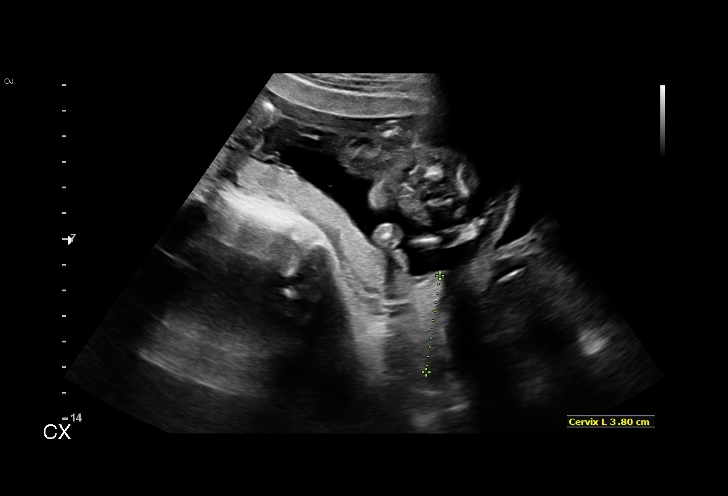
[im 83/83]
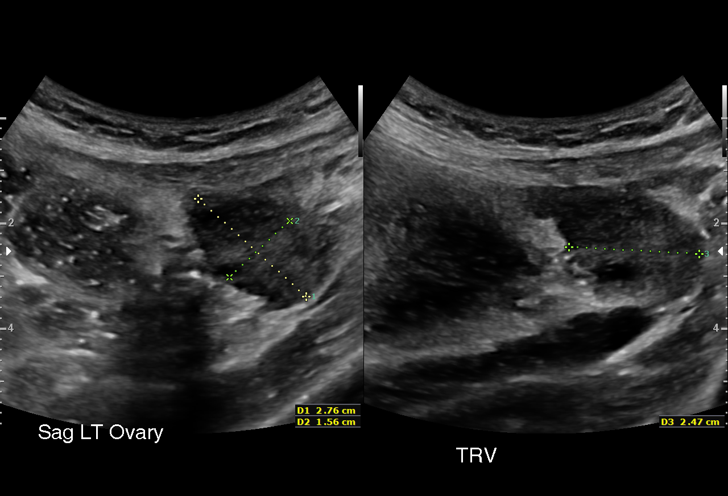

[14 of 28 positions shown; findings below may reference images not displayed]

OB/Gyn Clinic

1  ELANDIA MOSSMANN           [id number]      [id number]     [id number]
Indications

18 weeks gestation of pregnancy
Encounter for antenatal screening for
malformations
OB History

Gravidity:    2
TOP:          1        Living:  0
Fetal Evaluation

Num Of Fetuses:     1
Cardiac Activity:   Observed
Presentation:       Cephalic
Placenta:           Posterior, above cervical os
P. Cord Insertion:  Visualized

Amniotic Fluid
AFI FV:      Subjectively within normal limits

Largest Pocket(cm)
3.47
Biometry

BPD:        39  mm     G. Age:  17w 6d         16  %    CI:        71.56   %    70 - 86
FL/HC:      18.3   %    16.1 -
HC:      146.8  mm     G. Age:  17w 6d          9  %    HC/AC:      1.11        1.09 -
AC:      131.8  mm     G. Age:  18w 5d         46  %    FL/BPD:     68.7   %
FL:       26.8  mm     G. Age:  18w 1d         25  %    FL/AC:      20.3   %    20 - 24
HUM:      27.3  mm     G. Age:  18w 5d         52  %
CER:      19.2  mm     G. Age:  18w 4d         46  %
NFT:       3.3  mm
CM:        5.2  mm

Est. FW:     236  gm      0 lb 8 oz     38  %
Gestational Age

LMP:           18w 5d        Date:  01/23/17                 EDD:   10/30/17
U/S Today:     18w 1d                                        EDD:   11/03/17
Best:          18w 5d     Det. By:  LMP  (01/23/17)          EDD:   10/30/17
Anatomy

Cranium:               Appears normal         Aortic Arch:            Appears normal
Cavum:                 Appears normal         Ductal Arch:            Appears normal
Ventricles:            Appears normal         Diaphragm:              Appears normal
Choroid Plexus:        Appears normal         Stomach:                Appears normal, left
sided
Cerebellum:            Appears normal         Abdomen:                Appears normal
Posterior Fossa:       Appears normal         Abdominal Wall:         Appears nml (cord
insert, abd wall)
Nuchal Fold:           Appears normal         Cord Vessels:           Appears normal (3
vessel cord)
Face:                  Appears normal         Kidneys:                Appear normal
(orbits and profile)
Lips:                  Appears normal         Bladder:                Appears normal
Thoracic:              Appears normal         Spine:                  Appears normal
Heart:                 Appears normal         Upper Extremities:      Appears normal
(4CH, axis, and
situs)
RVOT:                  Appears normal         Lower Extremities:      Appears normal
LVOT:                  Appears normal

Other:  Parents do not wish to know sex of fetus at this time! (Female) Heels
and 5th digit visualized.
Cervix Uterus Adnexa

Cervix
Length:            3.8  cm.
Normal appearance by transabdominal scan.

Uterus
No abnormality visualized.

Left Ovary
Size(cm)       2.8  x   2.5    x  1.6       Vol(ml):
Within normal limits.

Right Ovary
Size(cm)       3.4  x   1.3    x  2.8       Vol(ml):
Within normal limits.

Cul De Sac:   No free fluid seen.

Adnexa:       No abnormality visualized.
Impression

Single living intrauterine pregnancy at 18w 5d.
Cephalic presentation.
Placenta Posterior, above cervical os.
Appropriate fetal growth.
Normal amniotic fluid volume.
The fetal anatomic survey is complete.
Normal fetal anatomy.
No fetal anomalies or soft markers of aneuploidy seen.
The adnexa appear normal bilaterally without masses.
The cervix measures 3.8cm on transabdominal imaging
without funneling.
Recommendations

Follow-up ultrasounds as clinically indicated.

## 2018-06-08 ENCOUNTER — Encounter: Payer: Self-pay | Admitting: Family Medicine

## 2018-06-10 ENCOUNTER — Ambulatory Visit (INDEPENDENT_AMBULATORY_CARE_PROVIDER_SITE_OTHER): Payer: Self-pay | Admitting: Family Medicine

## 2018-06-10 ENCOUNTER — Encounter: Payer: Self-pay | Admitting: Family Medicine

## 2018-06-10 VITALS — BP 119/82 | HR 70 | Resp 17 | Ht 63.0 in | Wt 113.2 lb

## 2018-06-10 DIAGNOSIS — F411 Generalized anxiety disorder: Secondary | ICD-10-CM

## 2018-06-10 DIAGNOSIS — F332 Major depressive disorder, recurrent severe without psychotic features: Secondary | ICD-10-CM

## 2018-06-10 DIAGNOSIS — Z658 Other specified problems related to psychosocial circumstances: Secondary | ICD-10-CM

## 2018-06-10 NOTE — Patient Instructions (Addendum)
Thank you for choosing Primary Care at [facility] to be your medical home!    Erin Sullivan was seen by Joaquin Courts, FNP today.   Erin Sullivan's primary care provider is Bing Neighbors, FNP.   For the best care possible, you should try to see Joaquin Courts, FNP-C whenever you come to the clinic.   We look forward to seeing you again soon!  If you have any questions about your visit today, please call us at [phone] or feel free to reach your primary care provider via MyChart.    Please schedule an appointment with our licensed counselor for her next availability.  I will see you back in office in 6 weeks and we will discuss the effectiveness of therapy.  At that time if you opt to try medications will discuss different options available compatible with breast-feeding to help manage your symptoms.  If symptoms worsen in the meantime you need to be seen sooner please follow-up here in the office to schedule an appointment.     Generalized Anxiety Disorder, Adult Generalized anxiety disorder (GAD) is a mental health disorder. People with this condition constantly worry about everyday events. Unlike normal anxiety, worry related to GAD is not triggered by a specific event. These worries also do not fade or get better with time. GAD interferes with life functions, including relationships, work, and school. GAD can vary from mild to severe. People with severe GAD can have intense waves of anxiety with physical symptoms (panic attacks). What are the causes? The exact cause of GAD is not known. What increases the risk? This condition is more likely to develop in:  Women.  People who have a family history of anxiety disorders.  People who are very shy.  People who experience very stressful life events, such as the death of a loved one.  People who have a very stressful family environment. What are the signs or symptoms? People with GAD often worry excessively about  many things in their lives, such as their health and family. They may also be overly concerned about:  Doing well at work.  Being on time.  Natural disasters.  Friendships. Physical symptoms of GAD include:  Fatigue.  Muscle tension or having muscle twitches.  Trembling or feeling shaky.  Being easily startled.  Feeling like your heart is pounding or racing.  Feeling out of breath or like you cannot take a deep breath.  Having trouble falling asleep or staying asleep.  Sweating.  Nausea, diarrhea, or irritable bowel syndrome (IBS).  Headaches.  Trouble concentrating or remembering facts.  Restlessness.  Irritability. How is this diagnosed? Your health care provider can diagnose GAD based on your symptoms and medical history. You will also have a physical exam. The health care provider will ask specific questions about your symptoms, including how severe they are, when they started, and if they come and go. Your health care provider may ask you about your use of alcohol or drugs, including prescription medicines. Your health care provider may refer you to a mental health specialist for further evaluation. Your health care provider will do a thorough examination and may perform additional tests to rule out other possible causes of your symptoms. To be diagnosed with GAD, a person must have anxiety that:  Is out of his or her control.  Affects several different aspects of his or her life, such as work and relationships.  Causes distress that makes him or her unable to take part in normal activities.  Includes at least three physical symptoms of GAD, such as restlessness, fatigue, trouble concentrating, irritability, muscle tension, or sleep problems. Before your health care provider can confirm a diagnosis of GAD, these symptoms must be present more days than they are not, and they must last for six months or longer. How is this treated? The following therapies are  usually used to treat GAD:  Medicine. Antidepressant medicine is usually prescribed for long-term daily control. Antianxiety medicines may be added in severe cases, especially when panic attacks occur.  Talk therapy (psychotherapy). Certain types of talk therapy can be helpful in treating GAD by providing support, education, and guidance. Options include: ? Cognitive behavioral therapy (CBT). People learn coping skills and techniques to ease their anxiety. They learn to identify unrealistic or negative thoughts and behaviors and to replace them with positive ones. ? Acceptance and commitment therapy (ACT). This treatment teaches people how to be mindful as a way to cope with unwanted thoughts and feelings. ? Biofeedback. This process trains you to manage your body's response (physiological response) through breathing techniques and relaxation methods. You will work with a therapist while machines are used to monitor your physical symptoms.  Stress management techniques. These include yoga, meditation, and exercise. A mental health specialist can help determine which treatment is best for you. Some people see improvement with one type of therapy. However, other people require a combination of therapies. Follow these instructions at home:  Take over-the-counter and prescription medicines only as told by your health care provider.  Try to maintain a normal routine.  Try to anticipate stressful situations and allow extra time to manage them.  Practice any stress management or self-calming techniques as taught by your health care provider.  Do not punish yourself for setbacks or for not making progress.  Try to recognize your accomplishments, even if they are small.  Keep all follow-up visits as told by your health care provider. This is important. Contact a health care provider if:  Your symptoms do not get better.  Your symptoms get worse.  You have signs of depression, such as: ? A  persistently sad, cranky, or irritable mood. ? Loss of enjoyment in activities that used to bring you joy. ? Change in weight or eating. ? Changes in sleeping habits. ? Avoiding friends or family members. ? Loss of energy for normal tasks. ? Feelings of guilt or worthlessness. Get help right away if:  You have serious thoughts about hurting yourself or others. If you ever feel like you may hurt yourself or others, or have thoughts about taking your own life, get help right away. You can go to your nearest emergency department or call:  Your local emergency services (911 in the [place]  A suicide crisis helpline, such as the National Suicide Prevention Lifeline at (947) 846-7124. This is open 24 hours a day. Summary  Generalized anxiety disorder (GAD) is a mental health disorder that involves worry that is not triggered by a specific event.  People with GAD often worry excessively about many things in their lives, such as their health and family.  GAD may cause physical symptoms such as restlessness, trouble concentrating, sleep problems, frequent sweating, nausea, diarrhea, headaches, and trembling or muscle twitching.  A mental health specialist can help determine which treatment is best for you. Some people see improvement with one type of therapy. However, other people require a combination of therapies. This information is not intended to replace advice given to you by your health care provider.  Make sure you discuss any questions you have with your health care provider. Document Released: 07/20/2012 Document Revised: 02/13/2016 Document Reviewed: 02/13/2016 Elsevier Interactive Patient Education  2019 [employer]  Major Depressive Disorder, Adult Major depressive disorder (MDD) is a mental health condition. MDD often makes you feel sad, hopeless, or helpless. MDD can also cause symptoms in your body. MDD can affect your:  Work.  School.  Relationships.  Other normal  activities. MDD can range from mild to very bad. It may occur once (single episode MDD). It can also occur many times (recurrent MDD). The main symptoms of MDD often include:  Feeling sad, depressed, or irritable most of the time.  Loss of interest. MDD symptoms also include:  Sleeping too much or too little.  Eating too much or too little.  A change in your weight.  Feeling tired (fatigue) or having low energy.  Feeling worthless.  Feeling guilty.  Trouble making decisions.  Trouble thinking clearly.  Thoughts of suicide or harming others.  Feeling weak.  Feeling agitated.  Keeping yourself from being around other people (isolation). Follow these instructions at home: Activity  Do these things as told by your doctor: ? Go back to your normal activities. ? Exercise regularly. ? Spend time outdoors. Alcohol  Talk with your doctor about how alcohol can affect your antidepressant medicines.  Do not drink alcohol. Or, limit how much alcohol you drink. ? This means no more than 1 drink a day for nonpregnant women and 2 drinks a day for men. One drink equals one of these:  12 oz of beer.  5 oz of wine.  1 oz of hard liquor. General instructions  Take over-the-counter and prescription medicines only as told by your doctor.  Eat a healthy diet.  Get plenty of sleep.  Find activities that you enjoy. Make time to do them.  Think about joining a support group. Your doctor may be able to suggest a group for you.  Keep all follow-up visits as told by your doctor. This is important. Where to find more information:  [employer] on Mental Illness: ? www.nami.org  [place] [employer] of Mental Health: ? http://www.maynard.net/  National Suicide Prevention Lifeline: ? (332)571-8664. This is free, 24-hour help. Contact a doctor if:  Your symptoms get worse.  You have new symptoms. Get help right away if:  You self-harm.  You see, hear, taste,  smell, or feel things that are not present (hallucinate). If you ever feel like you may hurt yourself or others, or have thoughts about taking your own life, get help right away. You can go to your nearest emergency department or call:  Your local emergency services (911 in the [place]  A suicide crisis helpline, such as the National Suicide Prevention Lifeline: ? 434 255 2139. This is open 24 hours a day. This information is not intended to replace advice given to you by your health care provider. Make sure you discuss any questions you have with your health care provider. Document Released: 03/06/2015 Document Revised: 12/10/2015 Document Reviewed: 12/10/2015 Elsevier Interactive Patient Education  2019 [employer].

## 2018-06-10 NOTE — Progress Notes (Signed)
Erin Sullivan, is a 27 y.o. female  HWK:[id number]  XYV:[id number]  DOB - 10-28-1990  CC:  Chief Complaint  Patient presents with  . Establish Care       HPI: Erin Sullivan is a 28 y.o. female is here today to establish care.   Erin Sullivan has Severe recurrent major depression without psychotic features (HCC); Alcohol abuse; Major depressive disorder, recurrent episode, severe (HCC); Alcohol abuse with intoxication (HCC); Suicidal ideation; Severe episode of recurrent major depressive disorder, without psychotic features (HCC); Bipolar disorder (HCC); and LGSIL on Pap smear of cervix on their problem list.   Patient with a significant history of mental health disorder presents today for evaluation of ongoing worsening generalized anxiety and depression.  Patient reports recently postpartum with her first child.  She is currently going through marital stress.  Her husband is gone for months at a time as a [employer] architect.  She is a new mother and primary caregiver of her child.  She is unemployed.  She has a mother and sister nearby however mother's health is in poor.  Patient has had an inpatient stay for previous suicidal ideations with a major depressive disorder back in 2017.  Patient adamantly denies any homicidal or suicidal ideations at present.  She endorses poor rest quality due to being mother of a newborn and then associated anxiousness prevents her from falling asleep.   She has significant family history of mental health disease: Her brother has severe mental health disease as he hears voices and has severe depression (patient is unaware of his specific diagnosis but reports he has had problems since he was in the fourth grade), mother has major depressive disorder with psychotic features she has been hospitalized multiple times in behavioral health for suicidal attempts, and she reports that her sister also suffers from major depressive disorder.  After discharge from her behavioral  health admission in 2017 medications were prescribed for her to take for major depressive disorder including Abilify, Prozac, and trazodone.  She reports she never took the medications after leaving the hospital as she will fears the side effects of medications.  She also notes that her mother was prescribed several medication has been on antipsychotics for years and it has not helped her symptoms and this is also discouraged her from attempting medication.  She reports which she is found the most beneficial is counseling.  She is currently uninsured and [facility] but had to stop her visits after she lost health insurance. She is interested in resuming counseling only initially, however, if symptoms remain present she will consider adding medication if necessary.  Current medications: Current Outpatient Medications:  .  Ascorbic Acid (VITAMIN C) 1000 MG tablet, Take 1,000 mg by mouth daily., Disp: , Rfl:  .  Prenatal Vit-Fe Fumarate-FA (PRENATAL MULTIVITAMIN) TABS tablet, Take 1 tablet by mouth daily at 12 noon., Disp: , Rfl:    Pertinent family medical history: family history includes Depression in her mother; Diabetes in her father.   No Known Allergies  Social History   Socioeconomic History  . Marital status: Married    Spouse name: Not on file  . Number of children: Not on file  . Years of education: Not on file  . Highest education level: Not on file  Occupational History  . Not on file  Social Needs  . Financial resource strain: Not on file  . Food insecurity:    Worry: Not on file  Inability: Not on file  . Transportation needs:    Medical: Not on file    Non-medical: Not on file  Tobacco Use  . Smoking status: Never Smoker  . Smokeless tobacco: Never Used  Substance and Sexual Activity  . Alcohol use: Not Currently  . Drug use: No    Comment: denied all dlrug use  . Sexual activity: Yes    Birth control/protection:  None  Lifestyle  . Physical activity:    Days per week: Not on file    Minutes per session: Not on file  . Stress: Not on file  Relationships  . Social connections:    Talks on phone: Not on file    Gets together: Not on file    Attends religious service: Not on file    Active member of club or organization: Not on file    Attends meetings of clubs or organizations: Not on file    Relationship status: Not on file  . Intimate partner violence:    Fear of current or ex partner: Not on file    Emotionally abused: Not on file    Physically abused: Not on file    Forced sexual activity: Not on file  Other Topics Concern  . Not on file  Social History Narrative  . Not on file    Review of Systems: Pertinent negatives listed in HPI Objective:   Vitals:   06/10/18 0941  BP: 119/82  Pulse: 70  Resp: 17  SpO2: 99%    BP Readings from Last 3 Encounters:  06/10/18 119/82  01/16/18 125/80  12/12/17 127/76    Filed Weights   06/10/18 0941  Weight: 113 lb 3.2 oz (51.3 kg)      Depression screen [facility] 2/9 06/10/2018 10/13/2017 10/06/2017 09/24/2017 09/12/2017  Decreased Interest 2 1 2 1 1   Down, Depressed, Hopeless 3 2 2 1 1   PHQ - 2 Score 5 3 4 2 2   Altered sleeping 3 3 3 3 3   Tired, decreased energy 3 1 2 1 2   Change in appetite 3 1 1 1 1   Feeling bad or failure about yourself  3 1 2 1 2   Trouble concentrating 3 1 2 1 2   Moving slowly or fidgety/restless 0 0 0 0 0  Suicidal thoughts 0 0 0 0 0  PHQ-9 Score 20 10 14 9 12   Difficult doing work/chores - - Somewhat difficult - -  Some recent data might be hidden    GAD 7 : Generalized Anxiety Score 06/10/2018 10/13/2017 10/06/2017 09/24/2017  Nervous, Anxious, on Edge 3 2 3 3   Control/stop worrying 2 2 3 2   Worry too much - different things 2 2 3 2   Trouble relaxing 3 1 2 1   Restless 1 0 1 1  Easily annoyed or irritable 1 0 1 0  Afraid - awful might happen 2 2 3 2   Total GAD 7 Score 14 9 16 11   Anxiety Difficulty - - Somewhat  difficult -      Physical Exam: General appearance: alert, well developed, well nourished, cooperative and in no distress Head: Normocephalic, without obvious abnormality, atraumatic Respiratory: Respirations even and unlabored, normal respiratory rate Heart: normal rate Extremities: No gross deformities Skin: Skin color, texture, turgor normal. No rashes seen  Psych: Appropriate mood and affect. Neurologic: Mental status: Alert, oriented to person, place, and time, thought content appropriate. Lab Results  Component Value Date   WBC 27.1 (H) 11/01/2017   HGB 10.6 (L) 11/01/2017  HCT 30.5 (L) 11/01/2017   MCV 90.2 11/01/2017   PLT 162 11/01/2017   Lab Results  Component Value Date   CREATININE 0.59 04/14/2015   BUN 11 04/14/2015   NA 145 04/14/2015   K 3.9 04/14/2015   CL 113 (H) 04/14/2015   CO2 20 (L) 04/14/2015       Assessment and plan:  1. Severe episode of recurrent major depressive disorder, without psychotic features (HCC) 2. GAD (generalized anxiety disorder) 3. Psychosocial stressors Patient would like to defer any medication management at this time.  She has a great concern regarding side effects of psychotropic medications.  She would like to complete at least 1-2 counseling sessions with our onsite licensed counselor and follow back up with me in 4 to 6 weeks to discuss appropriateness of proceeding with medication management.  Patient confirms that she is not suicidal and has not had any ideations of self harm or homicidal ideations>   PHQ 9 =20 and GAD 7=14   A total of 25  minutes spent, greater than 50 % of this time was spent reviewing prior office notes, obtaining HPI,  counseling and coordination of care.   Return for Schedule with Jasmine next available and provider in 6 weeks.   The patient was given clear instructions to go to ER or return to medical center if symptoms don't improve, worsen or new problems develop. The patient verbalized  understanding. The patient was advised  to call and obtain lab results if they haven't heard anything from out office within 7-10 business days.  Joaquin Courts, FNP [facility] [address] [place] [address] [phone]: [phone]    This note has been created with Dragon speech recognition software and Paediatric nurse. Any transcriptional errors are unintentional.

## 2018-06-23 ENCOUNTER — Institutional Professional Consult (permissible substitution): Payer: Self-pay | Admitting: Licensed Clinical Social Worker

## 2018-07-07 ENCOUNTER — Institutional Professional Consult (permissible substitution): Payer: Self-pay | Admitting: Licensed Clinical Social Worker

## 2018-07-22 ENCOUNTER — Ambulatory Visit: Payer: Self-pay | Admitting: Family Medicine
